# Patient Record
Sex: Female | Born: 1935
Health system: Southern US, Community
[De-identification: ages and names within clinical notes are randomized; demographics above are authoritative.]

## PROBLEM LIST (undated history)

## (undated) DIAGNOSIS — I10 Essential (primary) hypertension: Secondary | ICD-10-CM

## (undated) DIAGNOSIS — M1990 Unspecified osteoarthritis, unspecified site: Secondary | ICD-10-CM

## (undated) DIAGNOSIS — I4891 Unspecified atrial fibrillation: Secondary | ICD-10-CM

## (undated) DIAGNOSIS — F0390 Unspecified dementia without behavioral disturbance: Secondary | ICD-10-CM

## (undated) HISTORY — DX: Unspecified dementia, unspecified severity, without behavioral disturbance, psychotic disturbance, mood disturbance, and anxiety: F03.90

## (undated) HISTORY — DX: Unspecified osteoarthritis, unspecified site: M19.90

## (undated) HISTORY — DX: Unspecified atrial fibrillation: I48.91

## (undated) HISTORY — DX: Essential (primary) hypertension: I10

---

## 2019-12-27 DIAGNOSIS — Z20828 Contact with and (suspected) exposure to other viral communicable diseases: Secondary | ICD-10-CM | POA: Diagnosis not present

## 2020-01-03 DIAGNOSIS — K5900 Constipation, unspecified: Secondary | ICD-10-CM | POA: Diagnosis not present

## 2020-01-03 DIAGNOSIS — G8929 Other chronic pain: Secondary | ICD-10-CM | POA: Diagnosis not present

## 2020-01-03 DIAGNOSIS — I4891 Unspecified atrial fibrillation: Secondary | ICD-10-CM | POA: Diagnosis not present

## 2020-01-03 DIAGNOSIS — D6869 Other thrombophilia: Secondary | ICD-10-CM | POA: Diagnosis not present

## 2020-01-03 DIAGNOSIS — Z809 Family history of malignant neoplasm, unspecified: Secondary | ICD-10-CM | POA: Diagnosis not present

## 2020-01-03 DIAGNOSIS — Z7409 Other reduced mobility: Secondary | ICD-10-CM | POA: Diagnosis not present

## 2020-01-03 DIAGNOSIS — I10 Essential (primary) hypertension: Secondary | ICD-10-CM | POA: Diagnosis not present

## 2020-01-03 DIAGNOSIS — E785 Hyperlipidemia, unspecified: Secondary | ICD-10-CM | POA: Diagnosis not present

## 2020-01-03 DIAGNOSIS — R69 Illness, unspecified: Secondary | ICD-10-CM | POA: Diagnosis not present

## 2020-01-03 DIAGNOSIS — Z7901 Long term (current) use of anticoagulants: Secondary | ICD-10-CM | POA: Diagnosis not present

## 2020-01-30 DIAGNOSIS — L821 Other seborrheic keratosis: Secondary | ICD-10-CM | POA: Diagnosis not present

## 2020-01-30 DIAGNOSIS — L814 Other melanin hyperpigmentation: Secondary | ICD-10-CM | POA: Diagnosis not present

## 2020-02-21 DIAGNOSIS — I10 Essential (primary) hypertension: Secondary | ICD-10-CM | POA: Diagnosis not present

## 2020-02-21 DIAGNOSIS — E785 Hyperlipidemia, unspecified: Secondary | ICD-10-CM | POA: Diagnosis not present

## 2020-02-21 DIAGNOSIS — Z7901 Long term (current) use of anticoagulants: Secondary | ICD-10-CM | POA: Diagnosis not present

## 2020-02-21 DIAGNOSIS — I482 Chronic atrial fibrillation, unspecified: Secondary | ICD-10-CM | POA: Diagnosis not present

## 2020-04-30 DIAGNOSIS — H35371 Puckering of macula, right eye: Secondary | ICD-10-CM | POA: Diagnosis not present

## 2020-04-30 DIAGNOSIS — H401134 Primary open-angle glaucoma, bilateral, indeterminate stage: Secondary | ICD-10-CM | POA: Diagnosis not present

## 2020-05-19 DIAGNOSIS — E785 Hyperlipidemia, unspecified: Secondary | ICD-10-CM | POA: Diagnosis not present

## 2020-05-19 DIAGNOSIS — M25551 Pain in right hip: Secondary | ICD-10-CM | POA: Diagnosis not present

## 2020-05-19 DIAGNOSIS — R413 Other amnesia: Secondary | ICD-10-CM | POA: Diagnosis not present

## 2020-05-19 DIAGNOSIS — M1990 Unspecified osteoarthritis, unspecified site: Secondary | ICD-10-CM | POA: Diagnosis not present

## 2020-05-19 DIAGNOSIS — I10 Essential (primary) hypertension: Secondary | ICD-10-CM | POA: Diagnosis not present

## 2020-05-19 DIAGNOSIS — Z9181 History of falling: Secondary | ICD-10-CM | POA: Diagnosis not present

## 2020-05-19 DIAGNOSIS — I4891 Unspecified atrial fibrillation: Secondary | ICD-10-CM | POA: Diagnosis not present

## 2020-05-19 DIAGNOSIS — Z7901 Long term (current) use of anticoagulants: Secondary | ICD-10-CM | POA: Diagnosis not present

## 2020-05-19 DIAGNOSIS — M25552 Pain in left hip: Secondary | ICD-10-CM | POA: Diagnosis not present

## 2020-05-25 DIAGNOSIS — Z7901 Long term (current) use of anticoagulants: Secondary | ICD-10-CM | POA: Diagnosis not present

## 2020-05-25 DIAGNOSIS — M25552 Pain in left hip: Secondary | ICD-10-CM | POA: Diagnosis not present

## 2020-05-25 DIAGNOSIS — E785 Hyperlipidemia, unspecified: Secondary | ICD-10-CM | POA: Diagnosis not present

## 2020-05-25 DIAGNOSIS — Z9181 History of falling: Secondary | ICD-10-CM | POA: Diagnosis not present

## 2020-05-25 DIAGNOSIS — M25551 Pain in right hip: Secondary | ICD-10-CM | POA: Diagnosis not present

## 2020-05-25 DIAGNOSIS — M1990 Unspecified osteoarthritis, unspecified site: Secondary | ICD-10-CM | POA: Diagnosis not present

## 2020-05-25 DIAGNOSIS — R413 Other amnesia: Secondary | ICD-10-CM | POA: Diagnosis not present

## 2020-05-25 DIAGNOSIS — I10 Essential (primary) hypertension: Secondary | ICD-10-CM | POA: Diagnosis not present

## 2020-05-25 DIAGNOSIS — I4891 Unspecified atrial fibrillation: Secondary | ICD-10-CM | POA: Diagnosis not present

## 2020-05-29 DIAGNOSIS — M25551 Pain in right hip: Secondary | ICD-10-CM | POA: Diagnosis not present

## 2020-05-29 DIAGNOSIS — M1990 Unspecified osteoarthritis, unspecified site: Secondary | ICD-10-CM | POA: Diagnosis not present

## 2020-05-29 DIAGNOSIS — Z9181 History of falling: Secondary | ICD-10-CM | POA: Diagnosis not present

## 2020-05-29 DIAGNOSIS — Z7901 Long term (current) use of anticoagulants: Secondary | ICD-10-CM | POA: Diagnosis not present

## 2020-05-29 DIAGNOSIS — E785 Hyperlipidemia, unspecified: Secondary | ICD-10-CM | POA: Diagnosis not present

## 2020-05-29 DIAGNOSIS — I10 Essential (primary) hypertension: Secondary | ICD-10-CM | POA: Diagnosis not present

## 2020-05-29 DIAGNOSIS — R413 Other amnesia: Secondary | ICD-10-CM | POA: Diagnosis not present

## 2020-05-29 DIAGNOSIS — M25552 Pain in left hip: Secondary | ICD-10-CM | POA: Diagnosis not present

## 2020-05-29 DIAGNOSIS — I4891 Unspecified atrial fibrillation: Secondary | ICD-10-CM | POA: Diagnosis not present

## 2020-05-31 DIAGNOSIS — E785 Hyperlipidemia, unspecified: Secondary | ICD-10-CM | POA: Diagnosis not present

## 2020-05-31 DIAGNOSIS — M25552 Pain in left hip: Secondary | ICD-10-CM | POA: Diagnosis not present

## 2020-05-31 DIAGNOSIS — R413 Other amnesia: Secondary | ICD-10-CM | POA: Diagnosis not present

## 2020-05-31 DIAGNOSIS — I4891 Unspecified atrial fibrillation: Secondary | ICD-10-CM | POA: Diagnosis not present

## 2020-05-31 DIAGNOSIS — I10 Essential (primary) hypertension: Secondary | ICD-10-CM | POA: Diagnosis not present

## 2020-05-31 DIAGNOSIS — M25551 Pain in right hip: Secondary | ICD-10-CM | POA: Diagnosis not present

## 2020-05-31 DIAGNOSIS — M1990 Unspecified osteoarthritis, unspecified site: Secondary | ICD-10-CM | POA: Diagnosis not present

## 2020-05-31 DIAGNOSIS — Z9181 History of falling: Secondary | ICD-10-CM | POA: Diagnosis not present

## 2020-05-31 DIAGNOSIS — Z7901 Long term (current) use of anticoagulants: Secondary | ICD-10-CM | POA: Diagnosis not present

## 2020-06-05 DIAGNOSIS — I4891 Unspecified atrial fibrillation: Secondary | ICD-10-CM | POA: Diagnosis not present

## 2020-06-05 DIAGNOSIS — R413 Other amnesia: Secondary | ICD-10-CM | POA: Diagnosis not present

## 2020-06-05 DIAGNOSIS — E785 Hyperlipidemia, unspecified: Secondary | ICD-10-CM | POA: Diagnosis not present

## 2020-06-05 DIAGNOSIS — Z9181 History of falling: Secondary | ICD-10-CM | POA: Diagnosis not present

## 2020-06-05 DIAGNOSIS — M25552 Pain in left hip: Secondary | ICD-10-CM | POA: Diagnosis not present

## 2020-06-05 DIAGNOSIS — I10 Essential (primary) hypertension: Secondary | ICD-10-CM | POA: Diagnosis not present

## 2020-06-05 DIAGNOSIS — M25551 Pain in right hip: Secondary | ICD-10-CM | POA: Diagnosis not present

## 2020-06-05 DIAGNOSIS — M1990 Unspecified osteoarthritis, unspecified site: Secondary | ICD-10-CM | POA: Diagnosis not present

## 2020-06-05 DIAGNOSIS — Z7901 Long term (current) use of anticoagulants: Secondary | ICD-10-CM | POA: Diagnosis not present

## 2020-06-07 DIAGNOSIS — I4891 Unspecified atrial fibrillation: Secondary | ICD-10-CM | POA: Diagnosis not present

## 2020-06-07 DIAGNOSIS — R413 Other amnesia: Secondary | ICD-10-CM | POA: Diagnosis not present

## 2020-06-07 DIAGNOSIS — M25551 Pain in right hip: Secondary | ICD-10-CM | POA: Diagnosis not present

## 2020-06-07 DIAGNOSIS — E785 Hyperlipidemia, unspecified: Secondary | ICD-10-CM | POA: Diagnosis not present

## 2020-06-07 DIAGNOSIS — Z9181 History of falling: Secondary | ICD-10-CM | POA: Diagnosis not present

## 2020-06-07 DIAGNOSIS — M25552 Pain in left hip: Secondary | ICD-10-CM | POA: Diagnosis not present

## 2020-06-07 DIAGNOSIS — Z7901 Long term (current) use of anticoagulants: Secondary | ICD-10-CM | POA: Diagnosis not present

## 2020-06-07 DIAGNOSIS — I10 Essential (primary) hypertension: Secondary | ICD-10-CM | POA: Diagnosis not present

## 2020-06-07 DIAGNOSIS — M1990 Unspecified osteoarthritis, unspecified site: Secondary | ICD-10-CM | POA: Diagnosis not present

## 2020-06-12 DIAGNOSIS — I4891 Unspecified atrial fibrillation: Secondary | ICD-10-CM | POA: Diagnosis not present

## 2020-06-12 DIAGNOSIS — Z9181 History of falling: Secondary | ICD-10-CM | POA: Diagnosis not present

## 2020-06-12 DIAGNOSIS — E785 Hyperlipidemia, unspecified: Secondary | ICD-10-CM | POA: Diagnosis not present

## 2020-06-12 DIAGNOSIS — M1990 Unspecified osteoarthritis, unspecified site: Secondary | ICD-10-CM | POA: Diagnosis not present

## 2020-06-12 DIAGNOSIS — R413 Other amnesia: Secondary | ICD-10-CM | POA: Diagnosis not present

## 2020-06-12 DIAGNOSIS — M25552 Pain in left hip: Secondary | ICD-10-CM | POA: Diagnosis not present

## 2020-06-12 DIAGNOSIS — I10 Essential (primary) hypertension: Secondary | ICD-10-CM | POA: Diagnosis not present

## 2020-06-12 DIAGNOSIS — Z7901 Long term (current) use of anticoagulants: Secondary | ICD-10-CM | POA: Diagnosis not present

## 2020-06-12 DIAGNOSIS — M25551 Pain in right hip: Secondary | ICD-10-CM | POA: Diagnosis not present

## 2020-06-14 DIAGNOSIS — E785 Hyperlipidemia, unspecified: Secondary | ICD-10-CM | POA: Diagnosis not present

## 2020-06-14 DIAGNOSIS — Z7901 Long term (current) use of anticoagulants: Secondary | ICD-10-CM | POA: Diagnosis not present

## 2020-06-14 DIAGNOSIS — R413 Other amnesia: Secondary | ICD-10-CM | POA: Diagnosis not present

## 2020-06-14 DIAGNOSIS — M25552 Pain in left hip: Secondary | ICD-10-CM | POA: Diagnosis not present

## 2020-06-14 DIAGNOSIS — M1990 Unspecified osteoarthritis, unspecified site: Secondary | ICD-10-CM | POA: Diagnosis not present

## 2020-06-14 DIAGNOSIS — Z9181 History of falling: Secondary | ICD-10-CM | POA: Diagnosis not present

## 2020-06-14 DIAGNOSIS — I10 Essential (primary) hypertension: Secondary | ICD-10-CM | POA: Diagnosis not present

## 2020-06-14 DIAGNOSIS — M25551 Pain in right hip: Secondary | ICD-10-CM | POA: Diagnosis not present

## 2020-06-14 DIAGNOSIS — I4891 Unspecified atrial fibrillation: Secondary | ICD-10-CM | POA: Diagnosis not present

## 2020-06-19 DIAGNOSIS — M1990 Unspecified osteoarthritis, unspecified site: Secondary | ICD-10-CM | POA: Diagnosis not present

## 2020-06-19 DIAGNOSIS — I4891 Unspecified atrial fibrillation: Secondary | ICD-10-CM | POA: Diagnosis not present

## 2020-06-19 DIAGNOSIS — R413 Other amnesia: Secondary | ICD-10-CM | POA: Diagnosis not present

## 2020-06-19 DIAGNOSIS — I10 Essential (primary) hypertension: Secondary | ICD-10-CM | POA: Diagnosis not present

## 2020-06-19 DIAGNOSIS — Z9181 History of falling: Secondary | ICD-10-CM | POA: Diagnosis not present

## 2020-06-19 DIAGNOSIS — M25551 Pain in right hip: Secondary | ICD-10-CM | POA: Diagnosis not present

## 2020-06-19 DIAGNOSIS — E785 Hyperlipidemia, unspecified: Secondary | ICD-10-CM | POA: Diagnosis not present

## 2020-06-19 DIAGNOSIS — M25552 Pain in left hip: Secondary | ICD-10-CM | POA: Diagnosis not present

## 2020-06-19 DIAGNOSIS — Z7901 Long term (current) use of anticoagulants: Secondary | ICD-10-CM | POA: Diagnosis not present

## 2020-06-26 DIAGNOSIS — I10 Essential (primary) hypertension: Secondary | ICD-10-CM | POA: Diagnosis not present

## 2020-06-26 DIAGNOSIS — Z9181 History of falling: Secondary | ICD-10-CM | POA: Diagnosis not present

## 2020-06-26 DIAGNOSIS — I4891 Unspecified atrial fibrillation: Secondary | ICD-10-CM | POA: Diagnosis not present

## 2020-06-26 DIAGNOSIS — Z7901 Long term (current) use of anticoagulants: Secondary | ICD-10-CM | POA: Diagnosis not present

## 2020-06-26 DIAGNOSIS — R413 Other amnesia: Secondary | ICD-10-CM | POA: Diagnosis not present

## 2020-06-26 DIAGNOSIS — M1990 Unspecified osteoarthritis, unspecified site: Secondary | ICD-10-CM | POA: Diagnosis not present

## 2020-06-26 DIAGNOSIS — E785 Hyperlipidemia, unspecified: Secondary | ICD-10-CM | POA: Diagnosis not present

## 2020-06-26 DIAGNOSIS — M25552 Pain in left hip: Secondary | ICD-10-CM | POA: Diagnosis not present

## 2020-06-26 DIAGNOSIS — M25551 Pain in right hip: Secondary | ICD-10-CM | POA: Diagnosis not present

## 2020-07-05 DIAGNOSIS — E785 Hyperlipidemia, unspecified: Secondary | ICD-10-CM | POA: Diagnosis not present

## 2020-07-05 DIAGNOSIS — M25552 Pain in left hip: Secondary | ICD-10-CM | POA: Diagnosis not present

## 2020-07-05 DIAGNOSIS — I4891 Unspecified atrial fibrillation: Secondary | ICD-10-CM | POA: Diagnosis not present

## 2020-07-05 DIAGNOSIS — Z9181 History of falling: Secondary | ICD-10-CM | POA: Diagnosis not present

## 2020-07-05 DIAGNOSIS — M1990 Unspecified osteoarthritis, unspecified site: Secondary | ICD-10-CM | POA: Diagnosis not present

## 2020-07-05 DIAGNOSIS — Z7901 Long term (current) use of anticoagulants: Secondary | ICD-10-CM | POA: Diagnosis not present

## 2020-07-05 DIAGNOSIS — R413 Other amnesia: Secondary | ICD-10-CM | POA: Diagnosis not present

## 2020-07-05 DIAGNOSIS — M25551 Pain in right hip: Secondary | ICD-10-CM | POA: Diagnosis not present

## 2020-07-05 DIAGNOSIS — I10 Essential (primary) hypertension: Secondary | ICD-10-CM | POA: Diagnosis not present

## 2020-07-13 DIAGNOSIS — R413 Other amnesia: Secondary | ICD-10-CM | POA: Diagnosis not present

## 2020-07-13 DIAGNOSIS — M1990 Unspecified osteoarthritis, unspecified site: Secondary | ICD-10-CM | POA: Diagnosis not present

## 2020-07-13 DIAGNOSIS — I4891 Unspecified atrial fibrillation: Secondary | ICD-10-CM | POA: Diagnosis not present

## 2020-07-13 DIAGNOSIS — Z9181 History of falling: Secondary | ICD-10-CM | POA: Diagnosis not present

## 2020-07-13 DIAGNOSIS — E785 Hyperlipidemia, unspecified: Secondary | ICD-10-CM | POA: Diagnosis not present

## 2020-07-13 DIAGNOSIS — Z7901 Long term (current) use of anticoagulants: Secondary | ICD-10-CM | POA: Diagnosis not present

## 2020-07-13 DIAGNOSIS — M25551 Pain in right hip: Secondary | ICD-10-CM | POA: Diagnosis not present

## 2020-07-13 DIAGNOSIS — M25552 Pain in left hip: Secondary | ICD-10-CM | POA: Diagnosis not present

## 2020-07-13 DIAGNOSIS — I10 Essential (primary) hypertension: Secondary | ICD-10-CM | POA: Diagnosis not present

## 2020-10-02 DIAGNOSIS — K5900 Constipation, unspecified: Secondary | ICD-10-CM | POA: Diagnosis not present

## 2020-10-02 DIAGNOSIS — E669 Obesity, unspecified: Secondary | ICD-10-CM | POA: Diagnosis not present

## 2020-10-02 DIAGNOSIS — E785 Hyperlipidemia, unspecified: Secondary | ICD-10-CM | POA: Diagnosis not present

## 2020-10-02 DIAGNOSIS — I4891 Unspecified atrial fibrillation: Secondary | ICD-10-CM | POA: Diagnosis not present

## 2020-10-02 DIAGNOSIS — Z7982 Long term (current) use of aspirin: Secondary | ICD-10-CM | POA: Diagnosis not present

## 2020-10-02 DIAGNOSIS — M1990 Unspecified osteoarthritis, unspecified site: Secondary | ICD-10-CM | POA: Diagnosis not present

## 2020-10-02 DIAGNOSIS — R69 Illness, unspecified: Secondary | ICD-10-CM | POA: Diagnosis not present

## 2020-10-02 DIAGNOSIS — D6869 Other thrombophilia: Secondary | ICD-10-CM | POA: Diagnosis not present

## 2020-10-02 DIAGNOSIS — I10 Essential (primary) hypertension: Secondary | ICD-10-CM | POA: Diagnosis not present

## 2020-10-02 DIAGNOSIS — Z7901 Long term (current) use of anticoagulants: Secondary | ICD-10-CM | POA: Diagnosis not present

## 2020-10-09 DIAGNOSIS — E7849 Other hyperlipidemia: Secondary | ICD-10-CM | POA: Diagnosis not present

## 2020-10-09 DIAGNOSIS — I4891 Unspecified atrial fibrillation: Secondary | ICD-10-CM | POA: Diagnosis not present

## 2020-10-09 DIAGNOSIS — I10 Essential (primary) hypertension: Secondary | ICD-10-CM | POA: Diagnosis not present

## 2020-10-09 DIAGNOSIS — R609 Edema, unspecified: Secondary | ICD-10-CM | POA: Diagnosis not present

## 2020-10-09 DIAGNOSIS — Z681 Body mass index (BMI) 19 or less, adult: Secondary | ICD-10-CM | POA: Diagnosis not present

## 2020-10-10 DIAGNOSIS — H25813 Combined forms of age-related cataract, bilateral: Secondary | ICD-10-CM | POA: Diagnosis not present

## 2020-10-10 DIAGNOSIS — H401134 Primary open-angle glaucoma, bilateral, indeterminate stage: Secondary | ICD-10-CM | POA: Diagnosis not present

## 2020-12-24 DIAGNOSIS — Z681 Body mass index (BMI) 19 or less, adult: Secondary | ICD-10-CM | POA: Diagnosis not present

## 2020-12-24 DIAGNOSIS — R69 Illness, unspecified: Secondary | ICD-10-CM | POA: Diagnosis not present

## 2020-12-24 DIAGNOSIS — I4891 Unspecified atrial fibrillation: Secondary | ICD-10-CM | POA: Diagnosis not present

## 2020-12-24 DIAGNOSIS — M1991 Primary osteoarthritis, unspecified site: Secondary | ICD-10-CM | POA: Diagnosis not present

## 2021-02-21 ENCOUNTER — Encounter (INDEPENDENT_AMBULATORY_CARE_PROVIDER_SITE_OTHER): Payer: Self-pay

## 2021-02-21 ENCOUNTER — Other Ambulatory Visit: Payer: Self-pay

## 2021-02-21 ENCOUNTER — Encounter: Payer: Self-pay | Admitting: Internal Medicine

## 2021-02-21 ENCOUNTER — Ambulatory Visit (INDEPENDENT_AMBULATORY_CARE_PROVIDER_SITE_OTHER): Payer: Medicare HMO | Admitting: Internal Medicine

## 2021-02-21 VITALS — BP 112/64 | HR 75 | Temp 98.2°F | Ht 65.0 in | Wt 177.1 lb

## 2021-02-21 DIAGNOSIS — E559 Vitamin D deficiency, unspecified: Secondary | ICD-10-CM | POA: Diagnosis not present

## 2021-02-21 DIAGNOSIS — K5900 Constipation, unspecified: Secondary | ICD-10-CM | POA: Insufficient documentation

## 2021-02-21 DIAGNOSIS — R5381 Other malaise: Secondary | ICD-10-CM | POA: Insufficient documentation

## 2021-02-21 DIAGNOSIS — K5909 Other constipation: Secondary | ICD-10-CM | POA: Diagnosis not present

## 2021-02-21 DIAGNOSIS — Z7689 Persons encountering health services in other specified circumstances: Secondary | ICD-10-CM | POA: Diagnosis not present

## 2021-02-21 DIAGNOSIS — Z2821 Immunization not carried out because of patient refusal: Secondary | ICD-10-CM | POA: Diagnosis not present

## 2021-02-21 DIAGNOSIS — M179 Osteoarthritis of knee, unspecified: Secondary | ICD-10-CM | POA: Insufficient documentation

## 2021-02-21 DIAGNOSIS — R69 Illness, unspecified: Secondary | ICD-10-CM | POA: Diagnosis not present

## 2021-02-21 DIAGNOSIS — I480 Paroxysmal atrial fibrillation: Secondary | ICD-10-CM

## 2021-02-21 DIAGNOSIS — G309 Alzheimer's disease, unspecified: Secondary | ICD-10-CM

## 2021-02-21 DIAGNOSIS — I10 Essential (primary) hypertension: Secondary | ICD-10-CM

## 2021-02-21 DIAGNOSIS — I4891 Unspecified atrial fibrillation: Secondary | ICD-10-CM | POA: Insufficient documentation

## 2021-02-21 DIAGNOSIS — K5904 Chronic idiopathic constipation: Secondary | ICD-10-CM | POA: Insufficient documentation

## 2021-02-21 DIAGNOSIS — M170 Bilateral primary osteoarthritis of knee: Secondary | ICD-10-CM | POA: Diagnosis not present

## 2021-02-21 DIAGNOSIS — F0390 Unspecified dementia without behavioral disturbance: Secondary | ICD-10-CM | POA: Insufficient documentation

## 2021-02-21 DIAGNOSIS — F02B0 Dementia in other diseases classified elsewhere, moderate, without behavioral disturbance, psychotic disturbance, mood disturbance, and anxiety: Secondary | ICD-10-CM | POA: Insufficient documentation

## 2021-02-21 NOTE — Assessment & Plan Note (Signed)
On donepezil and memantine A&O X 3 currently, but sleeps at daytime and stays awake during nighttime No episodes of agitation, but does not talk much with other people than her daughter and son-in-law 

## 2021-02-21 NOTE — Assessment & Plan Note (Signed)
Care established History and medications reviewed with the patient 

## 2021-02-21 NOTE — Assessment & Plan Note (Signed)
Rate-controlled with Metoprolol On Eliquis Followed by Cardiology in [place] 

## 2021-02-21 NOTE — Progress Notes (Addendum)
New Patient Office Visit  Subjective:  Patient ID: Brittany Hanson, female    DOB: 1931-10-15  Age: 87 y.o. MRN: [id number]  CC:  Chief Complaint  Patient presents with   New Patient (Initial Visit)    New pt. Previous PCP [facility]. Needs evaluation for motorized wheelchair    HPI Brittany Hanson is an 87 y.o. female with PMH of atrial fibrillation, HTN, Alzheimer's dementia, diffuse OA and physical deconditioning who presents for establishing care.  Her daughter and son-in-law were present during the visit.  HTN: BP is well-controlled. Takes medications regularly. Patient denies headache, dizziness, chest pain, dyspnea or palpitations.  Atrial fibrillation: She is followed by cardiology in [place].  She is on metoprolol and Eliquis currently.  She does not have any history of cardiac procedure in the past.  Her heart rate is irregular in the office today.  Diffuse OA: She has history of OA of knee and hip.  It appears that she has OA of upper extremity as well.  She is wheelchair-bound most of the time of the day.  She gets up to use the restroom only, but she is dependent on wheelchair for ambulation at other times.  Due to limited mobility for a long time, she has underlying physical deconditioning as well.  She tried home PT in the past, but could not continue it due to severe pain.  She would benefit from electric wheelchair for better mobility and safety in the house, including getting to the bathroom.  Due to her OA of hands, she is not able to roll the walker or manual wheelchair.  Alzheimer's dementia: She is on donepezil and memantine currently.  She is dependent for her ADLs.  She is able to eat by herself, but daughter has to help prepare the meals, bathing and clothing.  She watches TV during the nighttime and and sleeps during the daytime.  Daughter has tried to orient her multiple times in the past.  She has not had any recent episodes of agitation.  Daughter does not  report any episodes of delusions or hallucinations.  She has not had COVID vaccine. She refuses flu vaccine.  Past Medical History:  Diagnosis Date   Atrial fibrillation (HCC)    Dementia (HCC)    Hypertension    Osteoarthritis     History reviewed. No pertinent surgical history.  History reviewed. No pertinent family history.  Social History   Socioeconomic History   Marital status: Widowed    Spouse name: Not on file   Number of children: Not on file   Years of education: Not on file   Highest education level: Not on file  Occupational History   Not on file  Tobacco Use   Smoking status: Never   Smokeless tobacco: Never  Substance and Sexual Activity   Alcohol use: Not on file   Drug use: Not on file   Sexual activity: Not on file  Other Topics Concern   Not on file  Social History Narrative   Not on file   Social Determinants of Health   Financial Resource Strain: Not on file  Food Insecurity: Not on file  Transportation Needs: Not on file  Physical Activity: Not on file  Stress: Not on file  Social Connections: Not on file  Intimate Partner Violence: Not on file    ROS Review of Systems  Constitutional:  Positive for fatigue. Negative for chills and fever.  HENT:  Negative for congestion, sinus pressure, sinus pain  and sore throat.   Eyes:  Negative for pain and discharge.  Respiratory:  Negative for cough and shortness of breath.   Cardiovascular:  Negative for chest pain and palpitations.  Gastrointestinal:  Positive for constipation. Negative for abdominal pain, diarrhea, nausea and vomiting.  Endocrine: Negative for polydipsia and polyuria.  Genitourinary:  Negative for dysuria and hematuria.  Musculoskeletal:  Positive for arthralgias, back pain and gait problem. Negative for neck pain and neck stiffness.  Skin:  Negative for rash.  Neurological:  Negative for dizziness and weakness.  Psychiatric/Behavioral:  Positive for decreased concentration  and sleep disturbance. Negative for agitation and behavioral problems.    Objective:   Today's Vitals: BP 112/64 (BP Location: Left Arm, Patient Position: Sitting, Cuff Size: Normal)   Pulse 75   Temp 98.2 F (36.8 C) (Oral)   Ht $R'5\' 5"'er$  (1.651 m)   Wt 177 lb 1.9 oz (80.3 kg)   SpO2 97%   BMI 29.47 kg/m   Physical Exam Vitals reviewed.  Constitutional:      General: She is not in acute distress.    Appearance: She is not diaphoretic.     Comments: In wheelchair  HENT:     Head: Normocephalic and atraumatic.     Mouth/Throat:     Mouth: Mucous membranes are moist.  Eyes:     General: No scleral icterus.    Extraocular Movements: Extraocular movements intact.  Cardiovascular:     Rate and Rhythm: Normal rate. Rhythm irregular.     Pulses: Normal pulses.     Heart sounds: Normal heart sounds. No murmur heard. Pulmonary:     Breath sounds: Normal breath sounds. No wheezing or rales.  Abdominal:     Palpations: Abdomen is soft.     Tenderness: There is no abdominal tenderness.  Musculoskeletal:        General: Swelling (B/l knee) present.     Cervical back: Neck supple. No tenderness.     Right lower leg: No edema.     Left lower leg: No edema.     Comments: ROM limited at b/l knee up to 45 degrees  Skin:    General: Skin is warm.     Findings: No rash.  Neurological:     General: No focal deficit present.     Mental Status: She is alert. Mental status is at baseline.     Motor: Weakness (RLE and LLE - 2/5, RUE and LUE - 2/5) present.     Gait: Gait abnormal (Non-ambulatory mostly).  Psychiatric:        Mood and Affect: Mood normal.        Behavior: Behavior normal.    Assessment & Plan:   Problem List Items Addressed This Visit       Encounter to establish care - Primary   Care established History and medications reviewed with the patient     Relevant Orders  HgB A1c    Cardiovascular and Mediastinum   Atrial fibrillation ([place])    Rate-controlled with  Metoprolol On Eliquis Followed by Cardiology in [place]      Relevant Medications   losartan (COZAAR) 100 MG tablet   hydrochlorothiazide (HYDRODIURIL) 25 MG tablet   metoprolol succinate (TOPROL-XL) 50 MG 24 hr tablet   ELIQUIS 5 MG TABS tablet   simvastatin (ZOCOR) 20 MG tablet   Other Relevant Orders   TSH   Essential hypertension    BP Readings from Last 1 Encounters:  02/21/21 112/64  Well-controlled with  Losartan, HCTZ and Metoprolol Counseled for compliance with the medications Advised DASH diet      Relevant Medications   losartan (COZAAR) 100 MG tablet   hydrochlorothiazide (HYDRODIURIL) 25 MG tablet   metoprolol succinate (TOPROL-XL) 50 MG 24 hr tablet   ELIQUIS 5 MG TABS tablet   simvastatin (ZOCOR) 20 MG tablet   Other Relevant Orders   CBC with Differential/Platelet   CMP14+EGFR     Digestive   Chronic constipation    Uses Miralax PRN      Relevant Medications   polyethylene glycol (MIRALAX / GLYCOLAX) 17 g packet     Nervous and Auditory   Dementia (HCC)    On donepezil and memantine A&O X 3 currently, but sleeps at daytime and stays awake during nighttime No episodes of agitation, but does not talk much with other people than her daughter and son-in-law      Relevant Medications   memantine (NAMENDA) 5 MG tablet   donepezil (ARICEPT) 10 MG tablet   Other Relevant Orders   DME Wheelchair electric     Musculoskeletal and Integument   OA (osteoarthritis) of knee    Tylenol PRN Due to diffuse OA, her mobility has been very limited      Relevant Medications   Acetaminophen (TYLENOL ARTHRITIS PAIN PO)   Other Relevant Orders   DME Wheelchair electric     Other               Physical deconditioning    Due to OA, dementia and lack of mobility Dependent for ADLs, daugher and son-in-law have been helping at home She had home PT in the past, does not want it currently  Has a wheelchair currently, but needs an electric wheelchair for better  mobility in her home independently, which will improve her quality of life  She can safely use electric wheelchair. She is willing and motivated to use the power mobility device in the home.     Relevant Orders   DME Wheelchair electric   TSH   Vitamin D (25 hydroxy)   Other Visit Diagnoses     Vitamin D deficiency       Relevant Orders   Vitamin D (25 hydroxy)       Outpatient Encounter Medications as of 02/21/2021  Medication Sig   Acetaminophen (TYLENOL ARTHRITIS PAIN PO) Take by mouth.   donepezil (ARICEPT) 10 MG tablet Take 10 mg by mouth at bedtime.   ELIQUIS 5 MG TABS tablet Take 5 mg by mouth 2 (two) times daily.   hydrochlorothiazide (HYDRODIURIL) 25 MG tablet Take 25 mg by mouth daily.   latanoprost (XALATAN) 0.005 % ophthalmic solution 1 drop at bedtime.   losartan (COZAAR) 100 MG tablet Take 100 mg by mouth daily.   memantine (NAMENDA) 5 MG tablet Take 5 mg by mouth 2 (two) times daily.   metoprolol succinate (TOPROL-XL) 50 MG 24 hr tablet Take 50 mg by mouth daily.   polyethylene glycol (MIRALAX / GLYCOLAX) 17 g packet Take 17 g by mouth daily.   simvastatin (ZOCOR) 20 MG tablet Take 20 mg by mouth at bedtime.   No facility-administered encounter medications on file as of 02/21/2021.    Follow-up: Return in about 3 months (around 05/24/2021).   Lindell Spar, MD

## 2021-02-21 NOTE — Assessment & Plan Note (Signed)
Tylenol PRN Due to diffuse OA, her mobility has been very limited

## 2021-02-21 NOTE — Patient Instructions (Signed)
Please continue to take medications as prescribed.  We will submit request for electric wheelchair today.  Please try to ambulate with the help of support as tolerated to avoid muscle atrophy and worsening of physical deconditioning.  Please let us know if you are interested in home physical therapy.

## 2021-02-21 NOTE — Assessment & Plan Note (Signed)
BP Readings from Last 1 Encounters:  02/21/21 112/64   Well-controlled with Losartan, HCTZ and Metoprolol Counseled for compliance with the medications Advised DASH diet

## 2021-02-21 NOTE — Assessment & Plan Note (Addendum)
Due to OA, dementia and lack of mobility Dependent for ADLs, daugher and son-in-law have been helping at home She had home PT in the past, does not want it currently  Has a wheelchair currently, but needs an electric wheelchair for better mobility in her home independently, which will improve her quality of life

## 2021-02-21 NOTE — Assessment & Plan Note (Signed)
Uses Miralax PRN

## 2021-03-04 ENCOUNTER — Telehealth: Payer: Self-pay

## 2021-03-04 NOTE — Telephone Encounter (Signed)
Patient daughter called back said can the wheelchair prescription be sent to [facility] home health in [place], [place], they take patient insurance and will cover the wheelchair.  Please contact patient daughter Bonita Quin once this has been sent to [facility] home health.  Call back # [phone].

## 2021-03-04 NOTE — Telephone Encounter (Signed)
Paperwork was faxed to [place] 03-04-21 awaiting form to be filled out by MD and will fax back please let daughter know

## 2021-03-04 NOTE — Telephone Encounter (Signed)
Patient daughter called asking about wheelchair if was it approved.  Daughter # [phone]. Daughter saying [place] does not accept her insurance.

## 2021-03-04 NOTE — Telephone Encounter (Signed)
noted 

## 2021-03-04 NOTE — Telephone Encounter (Signed)
Called patient daughter, patient daughter said patient has an PT appointment setup this week and need the dme order sent to [facility].  Fax the DME order for wheelchair to [facility] instead of [facility].

## 2021-03-05 DIAGNOSIS — Z7901 Long term (current) use of anticoagulants: Secondary | ICD-10-CM | POA: Diagnosis not present

## 2021-03-05 DIAGNOSIS — E785 Hyperlipidemia, unspecified: Secondary | ICD-10-CM | POA: Diagnosis not present

## 2021-03-05 DIAGNOSIS — I10 Essential (primary) hypertension: Secondary | ICD-10-CM | POA: Diagnosis not present

## 2021-03-05 DIAGNOSIS — I482 Chronic atrial fibrillation, unspecified: Secondary | ICD-10-CM | POA: Diagnosis not present

## 2021-03-12 ENCOUNTER — Other Ambulatory Visit: Payer: Self-pay | Admitting: *Deleted

## 2021-03-12 ENCOUNTER — Telehealth: Payer: Self-pay

## 2021-03-12 ENCOUNTER — Telehealth: Payer: Self-pay | Admitting: Internal Medicine

## 2021-03-12 DIAGNOSIS — R5381 Other malaise: Secondary | ICD-10-CM

## 2021-03-12 DIAGNOSIS — M170 Bilateral primary osteoarthritis of knee: Secondary | ICD-10-CM

## 2021-03-12 MED ORDER — METOPROLOL SUCCINATE ER 50 MG PO TB24: 50.0000 mg | ORAL_TABLET | Freq: Every day | ORAL | 0 refills | Status: DC

## 2021-03-12 NOTE — Telephone Encounter (Signed)
Pt wheelchair sent to [facility]

## 2021-03-12 NOTE — Telephone Encounter (Signed)
Spoke with Brittany Hanson she will be faxing over form from [facility] to be filled out with addended notes for pt wheelchair. We will be awaiting these forms.

## 2021-03-12 NOTE — Telephone Encounter (Signed)
Elease Hashimoto w/ [facility] called in speak w. Someone in regards to pt   Call back info [place] . [facility]   [phone]

## 2021-03-12 NOTE — Telephone Encounter (Signed)
Medication sent to pharmacy  

## 2021-03-12 NOTE — Telephone Encounter (Signed)
Patient called needs med refill Metotrolol 50 mg sent to [facility].

## 2021-03-13 ENCOUNTER — Telehealth: Payer: Self-pay

## 2021-03-13 ENCOUNTER — Other Ambulatory Visit: Payer: Self-pay | Admitting: *Deleted

## 2021-03-13 MED ORDER — SIMVASTATIN 20 MG PO TABS: 20.0000 mg | ORAL_TABLET | Freq: Every day | ORAL | 0 refills | Status: DC

## 2021-03-13 MED ORDER — DONEPEZIL HCL 10 MG PO TABS: 10.0000 mg | ORAL_TABLET | Freq: Every day | ORAL | 0 refills | Status: DC

## 2021-03-13 MED ORDER — HYDROCHLOROTHIAZIDE 25 MG PO TABS: 25.0000 mg | ORAL_TABLET | Freq: Every day | ORAL | 0 refills | Status: DC

## 2021-03-13 NOTE — Telephone Encounter (Signed)
Need med refills  simvastatin (ZOCOR) 20 MG tablet  hydrochlorothiazide (HYDRODIURIL) 25 MG tablet   donepezil (ARICEPT) 10 MG tablet   Pharmacy: [facility] [place]

## 2021-03-13 NOTE — Telephone Encounter (Signed)
Pt medication sent to pharmacy  

## 2021-03-19 ENCOUNTER — Ambulatory Visit: Payer: Medicare HMO | Admitting: Internal Medicine

## 2021-03-22 ENCOUNTER — Telehealth: Payer: Self-pay

## 2021-03-22 NOTE — Telephone Encounter (Signed)
Brittany Hanson called [facility] asking for copy of the revised office visit note for 10.13.2022.  call back # [phone].

## 2021-03-26 NOTE — Telephone Encounter (Signed)
Addended chart notes faxed back over to patricia at [facility]

## 2021-04-17 ENCOUNTER — Other Ambulatory Visit: Payer: Self-pay

## 2021-04-17 ENCOUNTER — Encounter (HOSPITAL_COMMUNITY): Payer: Self-pay

## 2021-04-17 ENCOUNTER — Ambulatory Visit (HOSPITAL_COMMUNITY): Payer: Medicare HMO | Attending: Internal Medicine

## 2021-04-17 DIAGNOSIS — R29898 Other symptoms and signs involving the musculoskeletal system: Secondary | ICD-10-CM | POA: Diagnosis not present

## 2021-04-17 NOTE — Therapy (Addendum)
[place], [place], [address] Phone: [phone]   Fax:  [phone]  Occupational Therapy Wheelchair Evaluation  Patient Details  Name: Brittany Hanson MRN: [id number] Date of Birth: 1932/04/15 Referring Provider (OT): Ave Filter, MD   Encounter Date: 04/17/2021   OT End of Session - 04/17/21 1600     Visit Number 1    Number of Visits 1    Authorization Type [employer]    Authorization Time Period $35 copay. No visit limit    Progress Note Due on Visit 10    OT Start Time 1300    OT Stop Time 1400    OT Time Calculation (min) 60 min    Activity Tolerance Patient tolerated treatment well    Behavior During Therapy WFL for tasks assessed/performed             Past Medical History:  Diagnosis Date   Atrial fibrillation ([place])    Dementia ([place])    Hypertension    Osteoarthritis     History reviewed. No pertinent surgical history.  There were no vitals filed for this visit.      [facility] OT Assessment - 04/17/21 1559       Assessment   Medical Diagnosis Wheelchair Evaluation    Referring Provider (OT) Ave Filter, MD      Precautions   Precautions Fall                 Mobility/Seating Evaluation    PATIENT INFORMATION: Name: Brittany Hanson DOB: 03/05/1932  Sex: Female Date seen: 04/17/21 Time: 1:00PM  Address:  [address] [place], [place] [address] Physician: Ihor Dow, MD This evaluation/justification form will serve as the LMN for the following suppliers: __________________________ Supplier: [employer] Contact Person: Casper Harrison Phone:  [phone]   Seating Therapist: Josue Hector Phone:   [phone]   Phone: [phone]    Spouse/Parent/Caregiver name: Brittany Hanson (daughter) Brittany Hanson (son-in-law) - Provide assist at home.   Phone number: [phone] Insurance/Payer: Bernadene Person     Reason for Referral: To obtain a  functional transport chair.   Patient/Caregiver Goals: To be able to move in her environment safely and independently while completing activities of daily living.  Patient was seen for face-to-face evaluation for new transport chair.  Also present was [employer] to discuss recommendations and wheelchair options.  Further paperwork was completed and sent to vendor.  Patient appears to qualify for manual mobility device at this time per objective findings.   MEDICAL HISTORY: Diagnosis: Primary Diagnosis: OA of bilateral knees Onset: several years Diagnosis: Alzheimers, dementia,Bilateral hips OA (reports bone on bone on the right side), HTN, Chronic A-Fib    '[]'$ Progressive Disease Relevant past and future surgeries: None   Height: 5'54" Weight: 174lb.  Explain recent changes or trends in weight: N/A   History including Falls: None    HOME ENVIRONMENT: $RemoveBeforeDEI'[x]'fpLjGHAvyRDXDbOK$ House  $Remo'[]'bRfty$ Condo/town home  $Rem'[]'BfGX$ Apartment  $RemoveBe'[]'DAeUCBQBy$ Assisted Living    '[]'$ Lives Alone $RemoveBef'[x]'gpYWLeHala$  Lives with Others  Hours with caregiver: N/A  [x] Home is accessible to patient           Stairs      [] Yes [x]  No     Ramp [x] Yes [] No Comments:  Lives with daughter and son-in-law, 2 granddaughters. Patient is never alone at home.      COMMUNITY ADL: TRANSPORTATION: [] Car    [x] Van    [] Public Transportation    [] Adapted w/c Lift    [] Ambulance    [] Other:       [] Sits in wheelchair during transport  Employment/School:  Specific requirements pertaining to mobility   Other: Family transports patient in [place]:  Handedness:   [x] Right     [] Left    [] NA  Comments:    Functional Processing Skills for Wheeled Mobility [x] Processing Skills are adequate for safe wheelchair operation  Areas of concern than may interfere with safe operation of wheelchair Description of problem   []  Attention to environment      [] Judgment      []   Hearing  []  Vision or visual processing      [] Motor Planning  []  Fluctuations in Behavior          VERBAL COMMUNICATION: [x] WFL receptive [x]  WFL expressive [x] Understandable  [] Difficult to understand  [] non-communicative []  Uses an augmented communication device  CURRENT SEATING / MOBILITY: Current Mobility Base:  [] None [x] Dependent [] Manual [] Scooter [] Power  Type of Control:   Manufacturer:  [id number] Size:  16x31 Age: 87 years old. (Daughter purchased herself)  Current Condition of Mobility Base:  Right side support clip not working adequately.   Current Wheelchair components:  [employer] Transport chair, flip down leg rests bilateral, sling back, no cushion   Describe posture in present seating system:  posterior pelvic tilt, rounded shoulder and forward head.      SENSATION and SKIN ISSUES: Sensation [x] Intact  [] Impaired [] Absent  Level of sensation:  Pressure Relief: Able to perform effective pressure relief :    [x] Yes  []  No Method: Patient is able to use walker to stand and relieve pressure. If not, Why?:   Skin Issues/Skin Integrity Current Skin Issues  [] Yes [x] No [] Intact []  Red area[]  Open Area  [] Scar Tissue [] At risk from prolonged sitting Where    History of Skin Issues  [] Yes [x] No Where   When    Hx of skin flap surgeries  [] Yes [x] No Where   When    Limited sitting tolerance [] Yes [x] No Hours spent sitting in wheelchair daily: 12+ hours is spent either sitting in transport when out in the community or at home in her lift chair.  Complaint of Pain:  Please describe: Complaints of pain noted when lower extremities are palpated to assess edema.     Swelling/Edema: bilateral lower extremity edema noted starting from knees down to toes. Pitting edema not present at this time.    ADL STATUS (in reference to wheelchair use):  Indep Assist Unable Indep with Equip Not assessed Comments  Dressing []  []  []  [x]  []  seated in wheelchair  Eating [x]  []  []  []   []    Toileting []  []  []  [x]  []  BSC  Bathing []  [x]  []  []  []  sponge bathes while seated. Daughter washed lower body seated at bathroom sink and hair in kitchen sink   Grooming/Hygiene []  [x]  []  []  []  Daughter assists   Meal Prep []  []  [x]  []  []  Family completes  IADLS []  []  [x]  []  []  Family completes  Bowel  Management: [x] Continent  [] Incontinent  [] Accidents Comments:    Bladder Management: [x] Continent  [] Incontinent  [] Accidents Comments:       WHEELCHAIR SKILLS: Manual w/c Propulsion: [x] UE or LE strength and endurance sufficient to participate in ADLs using manual wheelchair Arm : [] left [] right   [] Both      Distance:  Foot:  [] left [] right   [] Both  Operate Scooter: []  Strength, hand grip, balance and transfer appropriate for use [] Living environment is accessible for use of scooter  Operate Power w/c:  []  Std. Joystick   []  Alternative Controls Indep []  Assist []  Dependent/unable []  N/A []   [] Safe          []  Functional      Distance:   Bed confined without wheelchair []  Yes [x]  No   STRENGTH/RANGE OF MOTION:  Active Range of Motion Strength  Shoulder Bilateral shoulder flexion, abduction, IR/er: WFL Bilateral shoulder flexion, abduction, IR/er: 4+/5  Elbow Bilateral elbow flexion/extension: WFL Bilateral elbow flexion/extension: 4+/5  Wrist/Hand Bilateral wrist flexion/extension: WFL   Bilateral wrist flexion/extension: 4+/5. Bilateral gross grasp is weak.    Hip Right hip flexion is limited due to OA. Left hip flexion is WFL. Right hip flexion: 3-/5, left hip flexion: 4+/5  Knee Bilateral knee flexion/extension: WFL Right knee extension: 4-/5, Right knee flexion: 4+/5, Left knee flexion/extension: 4+/5  Ankle  Bilateral ankle dorsiflexion and plantar flexion: WFL Bilateral ankle dorsi flexion and plantar flexion: 4+/5     MOBILITY/BALANCE:  []  Patient is totally dependent for mobility  Family members provide total assist to propel tranport chair.     Balance Transfers  Ambulation  Sitting Balance: Standing Balance: [x]  Independent/Modified Independent []  Independent/Modified Independent  [x]  WFL     []  WFL []  Supervision []  Supervision  []  Uses UE for balance  []  Supervision []  Min Assist []  Ambulates with Assist      []  Min Assist [x]  Min assist []  Mod Assist [x]  Ambulates with Device:      [x]  RW  []  StW  []  Cane  []    []  Mod Assist []  Mod assist []  Max assist   []  Max Assist []  Max assist []  Dependent []  Indep. Short Distance Only  []  Unable []  Unable []  Lift / Sling Required Distance (in feet)  30   []  Sliding board []  Unable to Ambulate (see explanation below)  Cardio Status:  [] Intact  [x]  Impaired   []  NA     Chronic A-fib  Respiratory Status:  [x] Intact   [] Impaired   [] NA       Orthotics/Prosthetics: None  Comments (Address manual vs power w/c vs scooter): Patient is able to walk approximately 30 feet at a time before requiring a seated rest break. Patient spends the majority of her day seated in the recliner. Normally performs a sit to stand versus typical functional transfer; such as a stand pivot when going from surface to surface. Daughter, Brittany Hanson states that she will stand with her forearm walker then Brittany Hanson will switch out seats while standing. Completes sit to stand by pushing up from surface using both arms.           Anterior / Posterior Obliquity Rotation-Pelvis   PELVIS    [x]  []  []   Neutral Posterior Anterior  [x]  []  []   WFL Rt elev Lt elev  [x]  []  []   WFL Right Left                      Anterior  Anterior     []  Fixed []  Other []  Partly Flexible []  Flexible   []  Fixed []  Other []  Partly Flexible  []  Flexible  []  Fixed []  Other []  Partly Flexible  []  Flexible   TRUNK  [x]  []  []   WFL  Thoracic  Lumbar  Kyphosis Lordosis  [x]  []  []   WFL Convex Convex  Right Left [] c-curve [] s-curve [] multiple  [x]  Neutral []  Left-anterior []  Right-anterior     []  Fixed []  Flexible []  Partly Flexible []  Other  []  Fixed []   Flexible []  Partly Flexible []  Other  []  Fixed             []  Flexible []  Partly Flexible []  Other    Position Windswept    HIPS          [x]            []               []    Neutral       Abduct        ADduct         [x]           []            []   Neutral Right           Left      []  Fixed []  Subluxed []  Partly Flexible []  Dislocated []  Flexible  []  Fixed []  Other []  Partly Flexible  []  Flexible                 Foot Positioning Knee Positioning      [x]  WFL  [] Lt [] Rt [x]  WFL  [] Lt [] Rt    KNEES ROM concerns: ROM concerns:    & Dorsi-Flexed [] Lt [] Rt     FEET Plantar Flexed [] Lt [] Rt      Inversion                 [] Lt [] Rt      Eversion                 [] Lt [] Rt     HEAD [x]  Functional [x]  Good Head Control    & []  Flexed         []  Extended []  Adequate Head Control    NECK []  Rotated  Lt  []  Lat Flexed Lt []  Rotated  Rt []  Lat Flexed Rt []  Limited Head Control     []  Cervical Hyperextension []  Absent  Head Control     SHOULDERS ELBOWS WRIST& HAND       Left     Right    Left     Right    Left     Right   U/E [x] Functional           [x] Functional functional functional [] Fisting             [] Fisting      [] elev   [] dep      [] elev   [] dep       [] pro -[] retract     [] pro  [] retract [] subluxed             [] subluxed           Goals for Wheelchair Mobility  []  Independence with mobility in the home with motor related ADLs (MRADLs)  []  Independence with MRADLs in the community [x]  Provide dependent mobility  []  Provide recline     [] Provide tilt   Goals for Seating system []   Optimize pressure distribution []  Provide support needed to facilitate function or safety []  Provide corrective forces to assist with maintaining or improving posture []  Accommodate client's posture:   current seated postures and positions are not flexible or will not tolerate corrective forces []  Client to be independent with relieving pressure in the wheelchair [] Enhance physiological function  such as breathing, swallowing, digestion  Simulation ideas/Equipment trials: State why other equipment was unsuccessful:.Patient is able to ambulate short distances at home using her walker and does not require the extensive assistance from a mobility device such as a manual wheelchair or scooter. She is only able to ambulate short distance and family provides supervision for all mobility at home and in the community.        MOBILITY BASE RECOMMENDATIONS and JUSTIFICATION: MOBILITY COMPONENT JUSTIFICATION  Manufacturer: [employer] Model: Catalyst 5   Size: Width 19 Seat Depth 18  [x] provide transport from point A to B      [x] promote Indep mobility  [x] is not a safe, functional ambulator [] walker or cane inadequate [] non-standard width/depth necessary to accommodate anatomical measurement []    [x] Manual Mobility Base [x] non-functional ambulator    [] Scooter/POV  [] can safely operate  [] can safely transfer   [] has adequate trunk stability  [] cannot functionally propel manual w/c  [] Power Mobility Base  [] non-ambulatory  [] cannot functionally propel manual wheelchair  []  cannot functionally and safely operate scooter/POV [] can safely operate and willing to  [] Stroller Base [] infant/child  [] unable to propel manual wheelchair [] allows for growth [] non-functional ambulator [] non-functional UE [] Indep mobility is not a goal at this time  [] Tilt  [] Forward [] Backward [] Powered tilt  [] Manual tilt  [] change position against gravitational force on head and shoulders  [] change position for pressure relief/cannot weight shift [] transfers  [] management of tone [] rest periods [] control edema [] facilitate postural control  []    [] Recline  [] Power recline on power base [] Manual recline on manual base  [] accommodate femur to back angle  [] bring to full recline for ADL care  [] change position for pressure relief/cannot weight shift [] rest periods [] repositioning for transfers or  clothing/diaper /catheter changes [] head positioning  [x] Lighter weight required [x] self- propulsion  [x] lifting []    [] Heavy Duty required [] user weight greater than 250# [] extreme tone/ over active movement [] broken frame on previous chair []    [x]  Back  []  Angle Adjustable []  Custom molded Tension adjustable [] postural control [] control of tone/spasticity [] accommodation of range of motion [] UE functional control [] accommodation for seating system []   [] provide lateral trunk support [] accommodate deformity [x] provide posterior trunk support [] provide lumbar/sacral support [] support trunk in midline [] Pressure relief over spinal processes  [x]  Seat Cushion General use  [] impaired sensation  [] decubitus ulcers present [] history of pressure ulceration [] prevent pelvic extension [x] low maintenance  [] stabilize pelvis  [] accommodate obliquity [] accommodate multiple deformity [] neutralize lower extremity position [x] increase pressure distribution []    []  Pelvic/thigh support  []  Lateral thigh guide []  Distal medial pad  []  Distal lateral pad []  pelvis in neutral [] accommodate pelvis []  position upper legs []  alignment []  accommodate ROM []  decr adduction [] accommodate tone [] removable for transfers [] decr abduction  []  Lateral trunk Supports []  Lt     []  Rt [] decrease lateral trunk leaning [] control tone [] contour for increased contact [] safety  [] accommodate asymmetry []    []  Mounting hardware  [] lateral trunk supports  [] back   [] seat [] headrest      []  thigh support [] fixed   [] swing away [] attach seat platform/cushion to w/c frame [] attach back cushion to w/c frame [] mount postural supports [] mount  headrest  [] swing medial thigh support away [] swing lateral supports away for transfers  []      Armrests  [] fixed [x] adjustable height [] removable   [] swing away  [x] flip back   [] reclining [x] full length pads [] desk    [] pads tubular  [] provide support with  elbow at 90   [] provide support for w/c tray [] change of height/angles for variable activities [] remove for transfers [x] allow to come closer to table top [] remove for access to tables []    Hangers/ Leg rests  [] 60 [x] 70 [] 90 [] elevating [] heavy duty  [] articulating [] fixed [x] lift off [x] swing away     [] power [x] provide LE support  [] accommodate to hamstring tightness [] elevate legs during recline   [] provide change in position for Legs [] Maintain placement of feet on footplate [] durability [] enable transfers [] decrease edema [] Accommodate lower leg length []    Foot support Footplate    [x] Lt  [x]  Rt  []  Center mount [] flip up     [x] depth/angle adjustable [] Amputee adapter    []  Lt     []  Rt [x] provide foot support [] accommodate to ankle ROM [] transfers [] Provide support for residual extremity []  allow foot to go under wheelchair base []  decrease tone  []    [x]  Ankle strap/heel loops [x] support foot on foot support [] decrease extraneous movement [] provide input to heel  [] protect foot  Tires: [] pneumatic  [x] flat free inserts  [] solid  [x] decrease maintenance  [x] prevent frequent flats [] increase shock absorbency [] decrease pain from road shock [] decrease spasms from road shock []    []  Headrest  [] provide posterior head support [] provide posterior neck support [] provide lateral head support [] provide anterior head support [] support during tilt and recline [] improve feeding   [] improve respiration [] placement of switches [] safety  [] accommodate ROM  [] accommodate tone [] improve visual orientation  []  Anterior chest strap []  Vest []  Shoulder retractors  [] decrease forward movement of shoulder [] accommodation of TLSO [] decrease forward movement of trunk [] decrease shoulder elevation [] added abdominal support [] alignment [] assistance with shoulder control  []    Pelvic Positioner [] Belt [] SubASIS bar [] Dual Pull [] stabilize tone [] decrease falling out of  chair/ **will not Decr potential for sliding due to pelvic tilting [] prevent excessive rotation [] pad for protection over boney prominence [] prominence comfort [] special pull angle to control rotation []    Upper Extremity Support [] L   []  R [] Arm trough    [] hand support []  tray       [] full tray [] swivel mount [] decrease edema      [] decrease subluxation   [] control tone   [] placement for AAC/Computer/EADL [] decrease gravitational pull on shoulders [] provide midline positioning [] provide support to increase UE function [] provide hand support in natural position [] provide work surface  Other: Anti-tippers  For safety when traveling over uneven surfaces and during transportation.    POWER WHEELCHAIR CONTROLS  [] Proportional  [] Non-Proportional Type  [] Left  [] Right [] provides access for controlling wheelchair   [] lacks motor control to operate proportional drive control [] unable to understand proportional controls  Actuator Control Module  [] Single  [] Multiple   [] Allow the client to operate the power seat function(s) through the joystick control   [] Safety Reset Switches [] Used to change modes and stop the wheelchair when driving in latch mode    [] Upgraded Electronics   [] programming for accurate control [] progressive Disease/changing condition [] non-proportional drive control needed [] Needed in order to operate power seat functions through joystick control   [] Display box [] Allows user to see in which mode and drive the wheelchair is set  [] necessary for alternate controls    [] Digital interface electronics [] Allows  w/c to operate when using alternative drive controls  [] ASL Head Array [] Allows client to operate wheelchair  through switches placed in tri-panel headrest  [] Sip and puff with tubing kit [] needed to operate sip and puff drive controls  [] Upgraded tracking electronics [] increase safety when driving [] correct tracking when on uneven surfaces  [] Mount for switches  or joystick [] Attaches switches to w/c  [] Swing away for access or transfers [] midline for optimal placement [] provides for consistent access  [] Attendant controlled joystick plus mount [] safety [] long distance driving [] operation of seat functions [] compliance with transportation regulations []     Rear wheel placement/Axle adjustability [] None [] semi adjustable [] fully adjustable  [] improved UE access to wheels [] improved stability [] changing angle in space for improvement of postural stability [] 1-arm drive access [] amputee pad placement []    Wheel rims/ hand rims  [] metal  [] plastic coated [] oblique projections [] vertical projections [] Provide ability to propel manual wheelchair  []  Increase self-propulsion with hand weakness/decreased grasp  Push handles [] extended  [] angle adjustable  [] standard [] caregiver access [] caregiver assist [] allows "hooking" to enable increased ability to perform ADLs or maintain balance  One armed device  [] Lt   [] Rt [] enable propulsion of manual wheelchair with one arm   []      Brake/wheel lock extension []  Lt   []  Rt [] increase indep in applying wheel locks   [] Side guards [] prevent clothing getting caught in wheel or becoming soiled []  prevent skin tears/abrasions  Battery:  [] to power wheelchair   Other:     The above equipment has a life- long use expectancy. Growth and changes in medical and/or functional conditions would be the exceptions. This is to certify that the therapist has no financial relationship with durable medical provider or manufacturer. The therapist will not receive remuneration of any kind for the equipment recommended in this evaluation.   Patient has mobility limitation that significantly impairs safe, timely participation in one or more mobility related ADL's.  (bathing, toileting, feeding, dressing, grooming, moving from room to room)                                                             [x]  Yes []  No Will mobility  device sufficiently improve ability to participate and/or be aided in participation of MRADL's?         [x]  Yes []  No Can limitation be compensated for with use of a cane or walker?                                                                                []  Yes [x]  No Does patient or caregiver demonstrate ability/potential ability & willingness to safely use the mobility device?   [x]  Yes []  No Does patient's home environment support use of recommended mobility device?                                                    [  x] Yes []  No Does patient have sufficient upper extremity function necessary to functionally propel a manual wheelchair?    [x]  Yes []  No Does patient have sufficient strength and trunk stability to safely operate a POV (scooter)?                       N/A     []  Yes []  No Does patient need additional features/benefits provided by a power wheelchair for MRADL's in the home?       []  Yes [x]  No Does the patient demonstrate the ability to safely use a transport chair?                                                                    [x]  Yes []  No  Therapist Name Printed: Ailene Ravel OTR/L, CBIS Date: 04/17/21  Therapist's Signature:   Date:   Supplier's Name Printed:  Date:   Supplier's Signature:   Date:  Patient/Caregiver Signature:   Date:     This is to certify that I have read this evaluation and do agree with the content within:   24 Name Printed:   22 Signature:  Date:     This is to certify that I, the above signed therapist have the following affiliations: []  This DME provider []  Manufacturer of recommended equipment []  Patient's long term care facility [x]  None of the above       Plan - 04/17/21 1601     OT Occupational Profile and History Detailed Assessment- Review of Records and additional review of physical, cognitive, psychosocial history related to current functional performance    Occupational performance deficits  (Please refer to evaluation for details): ADL's;Leisure    Body Structure / Function / Physical Skills Strength;Balance;ADL    Rehab Potential Excellent    Clinical Decision Making Limited treatment options, no task modification necessary    Comorbidities Affecting Occupational Performance: Presence of comorbidities impacting occupational performance    Comorbidities impacting occupational performance description: See medical chart    Modification or Assistance to Complete Evaluation  No modification of tasks or assist necessary to complete eval    OT Frequency One time visit    OT Treatment/Interventions Other (comment)   Wheelchair evaluation only   Consulted and Agree with Plan of Care Patient;Family member/caregiver    Family Member Consulted daughter             Patient will benefit from skilled therapeutic intervention in order to improve the following deficits and impairments:   Body Structure / Function / Physical Skills: Strength, Balance, ADL       Visit Diagnosis: Other symptoms and signs involving the musculoskeletal system    Problem List Patient Active Problem List   Diagnosis Date Noted   Encounter to establish care 02/21/2021   Atrial fibrillation ([place]) 02/21/2021   Essential hypertension 02/21/2021   OA (osteoarthritis) of knee 02/21/2021   Dementia ([place]) 02/21/2021   Chronic constipation 02/21/2021   Physical deconditioning 02/21/2021    Ailene Ravel, OTR/L,CBIS  [phone]  04/17/2021, 4:02 PM  [facility] [address] [place], [place], [address] Phone: [phone]   Fax:  [phone]  Name: Brittany Hanson MRN: [id number] Date of  Birth: 1931/12/26

## 2021-05-01 DIAGNOSIS — H401134 Primary open-angle glaucoma, bilateral, indeterminate stage: Secondary | ICD-10-CM | POA: Diagnosis not present

## 2021-05-01 DIAGNOSIS — H25813 Combined forms of age-related cataract, bilateral: Secondary | ICD-10-CM | POA: Diagnosis not present

## 2021-05-02 ENCOUNTER — Telehealth: Payer: Self-pay | Admitting: Internal Medicine

## 2021-05-02 ENCOUNTER — Other Ambulatory Visit: Payer: Self-pay | Admitting: *Deleted

## 2021-05-02 MED ORDER — LOSARTAN POTASSIUM 100 MG PO TABS: 100.0000 mg | ORAL_TABLET | Freq: Every day | ORAL | 0 refills | Status: DC

## 2021-05-02 NOTE — Telephone Encounter (Signed)
Medication sent to pharmacy  

## 2021-05-02 NOTE — Telephone Encounter (Signed)
Pt needs refill on   losartan (COZAAR) 100 MG tablet   [facility] , [address]

## 2021-05-07 ENCOUNTER — Other Ambulatory Visit: Payer: Self-pay | Admitting: Internal Medicine

## 2021-05-23 ENCOUNTER — Ambulatory Visit (INDEPENDENT_AMBULATORY_CARE_PROVIDER_SITE_OTHER): Payer: Medicare HMO | Admitting: Internal Medicine

## 2021-05-23 ENCOUNTER — Other Ambulatory Visit: Payer: Self-pay

## 2021-05-23 ENCOUNTER — Encounter: Payer: Self-pay | Admitting: Internal Medicine

## 2021-05-23 VITALS — BP 108/72 | HR 68 | Resp 18 | Ht 65.0 in

## 2021-05-23 DIAGNOSIS — I10 Essential (primary) hypertension: Secondary | ICD-10-CM

## 2021-05-23 DIAGNOSIS — R69 Illness, unspecified: Secondary | ICD-10-CM | POA: Diagnosis not present

## 2021-05-23 DIAGNOSIS — R5381 Other malaise: Secondary | ICD-10-CM | POA: Diagnosis not present

## 2021-05-23 DIAGNOSIS — I480 Paroxysmal atrial fibrillation: Secondary | ICD-10-CM

## 2021-05-23 DIAGNOSIS — G309 Alzheimer's disease, unspecified: Secondary | ICD-10-CM | POA: Diagnosis not present

## 2021-05-23 DIAGNOSIS — F02B0 Dementia in other diseases classified elsewhere, moderate, without behavioral disturbance, psychotic disturbance, mood disturbance, and anxiety: Secondary | ICD-10-CM

## 2021-05-23 NOTE — Patient Instructions (Signed)
Please continue to take medications as prescribed.  Please eat at regular intervals and maintain adequate hydration by taking at least 50 ounces of fluid in a day.

## 2021-05-23 NOTE — Assessment & Plan Note (Signed)
Due to OA, dementia and lack of mobility Dependent for ADLs, daugher and son-in-law have been helping at home She had home PT in the past, does not want it currently  Has a wheelchair currently, but needs a wider wheelchair for better mobility in her home independently, which will improve her quality of life

## 2021-05-23 NOTE — Progress Notes (Signed)
Established Patient Office Visit  Subjective:  Patient ID: Brittany Hanson, female    DOB: [birth year]  Age: 88 y.o. MRN: [id number]  CC:  Chief Complaint  Patient presents with   Follow-up    3 month follow up memory may be getting worse     HPI Brittany Hanson is a 88 y.o. female with past medical history of atrial fibrillation, HTN, Alzheimer's dementia, diffuse OA and physical deconditioning who presents for f/u of her chronic medical conditions.  HTN: BP is well-controlled. Takes medications regularly. Patient denies headache, dizziness, chest pain, dyspnea or palpitations.   Atrial fibrillation: She is followed by cardiology in [place].  She is on metoprolol and Eliquis currently.  She does not have any history of cardiac procedure in the past.  Diffuse OA: She has history of OA of knee and hip.  It appears that she has OA of upper extremity as well. She has had PT eval for wheelchair and is awaiting wheelchair. She has a regular wheelchair for now that her daughter has bought from [employer].  She gets up to use the restroom only, but she is dependent on wheelchair for ambulation at other times.  Alzheimer's dementia: Daughter is concerned about memory decline. She is on donepezil and memantine currently.  She is dependent for her ADLs.  She is able to eat by herself, but daughter has to help prepare the meals, bathing and clothing.  She watches TV during the nighttime and and sleeps during the daytime.  Daughter denies any episodes of agitation, delusion, hallucinations or wandering.   Past Medical History:  Diagnosis Date   Atrial fibrillation (HCC)    Dementia (HCC)    Hypertension    Osteoarthritis     History reviewed. No pertinent surgical history.  History reviewed. No pertinent family history.  Social History   Socioeconomic History   Marital status: Widowed    Spouse name: Not on file   Number of children: Not on file   Years of education: Not on file    Highest education level: Not on file  Occupational History   Not on file  Tobacco Use   Smoking status: Never   Smokeless tobacco: Never  Substance and Sexual Activity   Alcohol use: Not on file   Drug use: Not on file   Sexual activity: Not on file  Other Topics Concern   Not on file  Social History Narrative   Not on file   Social Determinants of Health   Financial Resource Strain: Not on file  Food Insecurity: Not on file  Transportation Needs: Not on file  Physical Activity: Not on file  Stress: Not on file  Social Connections: Not on file  Intimate Partner Violence: Not on file    Outpatient Medications Prior to Visit  Medication Sig Dispense Refill   Acetaminophen (TYLENOL ARTHRITIS PAIN PO) Take by mouth.     donepezil (ARICEPT) 10 MG tablet Take 1 tablet (10 mg total) by mouth at bedtime. 90 tablet 0   ELIQUIS 5 MG TABS tablet Take 5 mg by mouth 2 (two) times daily.     hydrochlorothiazide (HYDRODIURIL) 25 MG tablet Take 1 tablet (25 mg total) by mouth daily. 90 tablet 0   latanoprost (XALATAN) 0.005 % ophthalmic solution 1 drop at bedtime.     losartan (COZAAR) 100 MG tablet Take 1 tablet (100 mg total) by mouth daily. 90 tablet 0   memantine (NAMENDA) 5 MG tablet Take 5 mg by mouth  2 (two) times daily.     metoprolol succinate (TOPROL-XL) 50 MG 24 hr tablet TAKE 1 TABLET BY MOUTH EVERY DAY 90 tablet 0   polyethylene glycol (MIRALAX / GLYCOLAX) 17 g packet Take 17 g by mouth daily.     simvastatin (ZOCOR) 20 MG tablet Take 1 tablet (20 mg total) by mouth at bedtime. 90 tablet 0   No facility-administered medications prior to visit.    No Known Allergies  ROS Review of Systems  Constitutional:  Positive for fatigue. Negative for chills and fever.  HENT:  Negative for congestion, sinus pressure, sinus pain and sore throat.   Eyes:  Negative for pain and discharge.  Respiratory:  Negative for cough and shortness of breath.   Cardiovascular:  Negative for chest  pain and palpitations.  Gastrointestinal:  Positive for constipation. Negative for abdominal pain, diarrhea, nausea and vomiting.  Endocrine: Negative for polydipsia and polyuria.  Genitourinary:  Negative for dysuria and hematuria.  Musculoskeletal:  Positive for arthralgias, back pain and gait problem. Negative for neck pain and neck stiffness.  Skin:  Negative for rash.  Neurological:  Negative for dizziness and weakness.  Psychiatric/Behavioral:  Positive for decreased concentration and sleep disturbance. Negative for agitation and behavioral problems.      Objective:    Physical Exam Vitals reviewed.  Constitutional:      General: She is not in acute distress.    Appearance: She is not diaphoretic.     Comments: In wheelchair  HENT:     Head: Normocephalic and atraumatic.     Mouth/Throat:     Mouth: Mucous membranes are moist.  Eyes:     General: No scleral icterus.    Extraocular Movements: Extraocular movements intact.  Cardiovascular:     Rate and Rhythm: Normal rate. Rhythm irregular.     Pulses: Normal pulses.     Heart sounds: Normal heart sounds. No murmur heard. Pulmonary:     Breath sounds: Normal breath sounds. No wheezing or rales.  Abdominal:     Palpations: Abdomen is soft.     Tenderness: There is no abdominal tenderness.  Musculoskeletal:        General: Swelling (B/l knee) present.     Cervical back: Neck supple. No tenderness.     Right lower leg: No edema.     Left lower leg: No edema.     Comments: ROM limited at b/l knee up to 45 degrees  Skin:    General: Skin is warm.     Findings: No rash.  Neurological:     General: No focal deficit present.     Mental Status: She is alert. Mental status is at baseline.     Motor: Weakness (RLE and LLE - 2/5, RUE and LUE - 2/5) present.     Gait: Gait abnormal (Non-ambulatory mostly).  Psychiatric:        Mood and Affect: Mood normal.        Behavior: Behavior normal.    BP 108/72 (BP Location: Left  Arm, Patient Position: Sitting, Cuff Size: Normal)    Pulse 68    Resp 18    Ht $R'5\' 5"'kI$  (1.651 m)    SpO2 98%    BMI 29.47 kg/m  Wt Readings from Last 3 Encounters:  02/21/21 177 lb 1.9 oz (80.3 kg)    No results found for: TSH No results found for: WBC, HGB, HCT, MCV, PLT No results found for: NA, K, CHLORIDE, CO2, GLUCOSE, BUN, CREATININE, BILITOT,  ALKPHOS, AST, ALT, PROT, ALBUMIN, CALCIUM, ANIONGAP, EGFR, GFR No results found for: CHOL No results found for: HDL No results found for: LDLCALC No results found for: TRIG No results found for: CHOLHDL No results found for: HGBA1C    Assessment & Plan:   Problem List Items Addressed This Visit       Cardiovascular and Mediastinum   Atrial fibrillation ([place])    Rate-controlled with Metoprolol On Eliquis Followed by Cardiology in [place] hypertension - Primary    BP Readings from Last 1 Encounters:  05/23/21 108/72  Well-controlled with Losartan, HCTZ and Metoprolol Counseled for compliance with the medications Advised DASH diet        Nervous and Auditory   Dementia ([place])    On donepezil and memantine A&O X 1 currently, but sleeps at daytime and stays awake during nighttime No episodes of agitation, but does not talk much with other people than her daughter and son-in-law Discussed with daughter that her condition is progressive and she is already on 2 meds for it. Needs to improve on sleeping habit.        Other   Physical deconditioning    Due to OA, dementia and lack of mobility Dependent for ADLs, daugher and son-in-law have been helping at home She had home PT in the past, does not want it currently  Has a wheelchair currently, but needs a wider wheelchair for better mobility in her home independently, which will improve her quality of life        No orders of the defined types were placed in this encounter.   Follow-up: Return in about 6 months (around 11/20/2021) for Annual physical.     Lindell Spar, MD

## 2021-05-23 NOTE — Assessment & Plan Note (Signed)
Rate-controlled with Metoprolol On Eliquis Followed by Cardiology in [place] 

## 2021-05-23 NOTE — Assessment & Plan Note (Signed)
BP Readings from Last 1 Encounters:  05/23/21 108/72   Well-controlled with Losartan, HCTZ and Metoprolol Counseled for compliance with the medications Advised DASH diet

## 2021-05-23 NOTE — Assessment & Plan Note (Signed)
On donepezil and memantine A&O X 1 currently, but sleeps at daytime and stays awake during nighttime No episodes of agitation, but does not talk much with other people than her daughter and son-in-law Discussed with daughter that her condition is progressive and she is already on 2 meds for it. Needs to improve on sleeping habit.

## 2021-05-27 ENCOUNTER — Other Ambulatory Visit: Payer: Self-pay | Admitting: Internal Medicine

## 2021-05-29 ENCOUNTER — Other Ambulatory Visit: Payer: Self-pay | Admitting: Internal Medicine

## 2021-06-10 ENCOUNTER — Other Ambulatory Visit: Payer: Self-pay | Admitting: Internal Medicine

## 2021-06-11 ENCOUNTER — Other Ambulatory Visit: Payer: Self-pay

## 2021-06-11 ENCOUNTER — Ambulatory Visit (INDEPENDENT_AMBULATORY_CARE_PROVIDER_SITE_OTHER): Payer: Medicare HMO

## 2021-06-11 DIAGNOSIS — Z0000 Encounter for general adult medical examination without abnormal findings: Secondary | ICD-10-CM | POA: Diagnosis not present

## 2021-06-11 DIAGNOSIS — Z780 Asymptomatic menopausal state: Secondary | ICD-10-CM

## 2021-06-11 NOTE — Progress Notes (Addendum)
Subjective:   Brittany Hanson is a 88 y.o. female who presents for Medicare Annual (Subsequent) preventive examination. I connected with  Brittany Hanson on 06/11/21 by a audio enabled telemedicine application and verified that I am speaking with the correct person using two identifiers.  Patient Location: Home  Provider Location: Office/Clinic  I discussed the limitations of evaluation and management by telemedicine. The patient expressed understanding and agreed to proceed.  These are the goals we discussed:  Goals   None     This is a list of the screening recommended for you and due dates:  Health Maintenance  Topic Date Due   COVID-19 Vaccine (1) Never done   Tetanus Vaccine  Never done   Zoster (Shingles) Vaccine (1 of 2) Never done   Pneumonia Vaccine (1 - PCV) Never done   DEXA scan (bone density measurement)  Never done   Flu Shot  08/09/2021*   HPV Vaccine  Aged Out  *Topic was postponed. The date shown is not the original due date.    Review of Systems    Defer to PCP Cardiac Risk Factors include: advanced age (>75men, >46 women);hypertension;sedentary lifestyle     Objective:    There were no vitals filed for this visit. There is no height or weight on file to calculate BMI.  Advanced Directives 06/11/2021  Does Patient Have a Medical Advance Directive? Yes  Type of Advance Directive Healthcare Power of Attorney  Copy of Healthcare Power of Attorney in Chart? No - copy requested    Current Medications (verified) Outpatient Encounter Medications as of 06/11/2021  Medication Sig   Acetaminophen (TYLENOL ARTHRITIS PAIN PO) Take by mouth.   donepezil (ARICEPT) 10 MG tablet Take 1 tablet (10 mg total) by mouth at bedtime.   ELIQUIS 5 MG TABS tablet Take 5 mg by mouth 2 (two) times daily.   hydrochlorothiazide (HYDRODIURIL) 25 MG tablet Take 1 tablet (25 mg total) by mouth daily.   latanoprost (XALATAN) 0.005 % ophthalmic solution 1 drop at bedtime.    losartan (COZAAR) 100 MG tablet TAKE 1 TABLET BY MOUTH EVERY DAY   memantine (NAMENDA) 5 MG tablet Take 5 mg by mouth 2 (two) times daily.   metoprolol succinate (TOPROL-XL) 50 MG 24 hr tablet TAKE 1 TABLET BY MOUTH EVERY DAY   polyethylene glycol (MIRALAX / GLYCOLAX) 17 g packet Take 17 g by mouth daily.   simvastatin (ZOCOR) 20 MG tablet TAKE 1 TABLET BY MOUTH EVERYDAY AT BEDTIME   No facility-administered encounter medications on file as of 06/11/2021.    Allergies (verified) Patient has no known allergies.   History: Past Medical History:  Diagnosis Date   Atrial fibrillation (HCC)    Dementia (HCC)    Hypertension    Osteoarthritis    History reviewed. No pertinent surgical history. Family History  Problem Relation Age of Onset   Diabetes Sister    Cancer Daughter    Social History   Socioeconomic History   Marital status: Widowed    Spouse name: Not on file   Number of children: 3   Years of education: 6th   Highest education level: Not on file  Occupational History   Not on file  Tobacco Use   Smoking status: Never   Smokeless tobacco: Never  Vaping Use   Vaping Use: Never used  Substance and Sexual Activity   Alcohol use: Never   Drug use: Never   Sexual activity: Not Currently  Other Topics Concern  Not on file  Social History Narrative   Not on file   Social Determinants of Health   Financial Resource Strain: Not on file  Food Insecurity: Not on file  Transportation Needs: Not on file  Physical Activity: Not on file  Stress: Not on file  Social Connections: Not on file    Tobacco Counseling Counseling given: Not Answered   Clinical Intake:  Pre-visit preparation completed: No  Pain : No/denies pain     Nutritional Risks: None Diabetes: No  How often do you need to have someone help you when you read instructions, pamphlets, or other written materials from your doctor or pharmacy?: 3 - Sometimes What is the last grade level you  completed in school?: 6th  Diabetic?no  Interpreter Needed?: No  Information entered by :: Brittany Hanson   Activities of Daily Living In your present state of health, do you have any difficulty performing the following activities: 06/11/2021 02/21/2021  Hearing? N Y  Vision? N N  Difficulty concentrating or making decisions? Y N  Walking or climbing stairs? Y Y  Dressing or bathing? Y Y  Doing errands, shopping? Malvin Johns  Preparing Food and eating ? Y -  Using the Toilet? N -  In the past six months, have you accidently leaked urine? Y -  Do you have problems with loss of bowel control? N -  Managing your Medications? Y -  Managing your Finances? Y -  Housekeeping or managing your Housekeeping? Y -  Some recent data might be hidden    Patient Care Team: Anabel Halon, MD as PCP - General (Internal Medicine)  Indicate any recent Medical Services you may have received from other than [facility] providers in the past year (date may be approximate).     Assessment:   This is a routine wellness examination for Brittany Hanson.  Hearing/Vision screen No results found.  Dietary issues and exercise activities discussed: Current Exercise Habits: The patient does not participate in regular exercise at present, Exercise limited by: cardiac condition(s);orthopedic condition(s)   Goals Addressed   None   Depression Screen PHQ 2/9 Scores 06/11/2021 05/23/2021 02/21/2021  PHQ - 2 Score 0 0 0    Fall Risk Fall Risk  06/11/2021 05/23/2021 02/21/2021  Falls in the past year? - 0 0  Number falls in past yr: 0 0 0  Injury with Fall? 0 0 0  Risk for fall due to : No Fall Risks History of fall(s);Impaired mobility No Fall Risks  Follow up Falls evaluation completed Falls evaluation completed;Falls prevention discussed Falls evaluation completed    FALL RISK PREVENTION PERTAINING TO THE HOME:  Any stairs in or around the home? Yes  If so, are there any without handrails? Yes  Home free of loose throw rugs  in walkways, pet beds, electrical cords, etc? Yes  Adequate lighting in your home to reduce risk of falls? Yes   ASSISTIVE DEVICES UTILIZED TO PREVENT FALLS:  Life alert? No  Use of a cane, walker or w/c? Yes  Grab bars in the bathroom? Yes  Shower chair or bench in shower? No  Elevated toilet seat or a handicapped toilet? Yes     Cognitive Function:        Immunizations  There is no immunization history on file for this patient.  TDAP status: Due, Education has been provided regarding the importance of this vaccine. Advised may receive this vaccine at local pharmacy or Health Dept. Aware to provide a copy of the vaccination  record if obtained from local pharmacy or Health Dept. Verbalized acceptance and understanding.  Flu Vaccine status: Due, Education has been provided regarding the importance of this vaccine. Advised may receive this vaccine at local pharmacy or Health Dept. Aware to provide a copy of the vaccination record if obtained from local pharmacy or Health Dept. Verbalized acceptance and understanding.  Pneumococcal vaccine status: Due, Education has been provided regarding the importance of this vaccine. Advised may receive this vaccine at local pharmacy or Health Dept. Aware to provide a copy of the vaccination record if obtained from local pharmacy or Health Dept. Verbalized acceptance and understanding.  Covid-19 vaccine status: Information provided on how to obtain vaccines.   Qualifies for Shingles Vaccine? Yes   Zostavax completed No   Shingrix Completed?: No.    Education has been provided regarding the importance of this vaccine. Patient has been advised to call insurance company to determine out of pocket expense if they have not yet received this vaccine. Advised may also receive vaccine at local pharmacy or Health Dept. Verbalized acceptance and understanding.  Screening Tests Health Maintenance  Topic Date Due   COVID-19 Vaccine (1) Never done    TETANUS/TDAP  Never done   Zoster Vaccines- Shingrix (1 of 2) Never done   Pneumonia Vaccine 2065+ Years old (1 - PCV) Never done   DEXA SCAN  Never done   INFLUENZA VACCINE  08/09/2021 (Originally 12/10/2020)   HPV VACCINES  Aged Out    Health Maintenance  Health Maintenance Due  Topic Date Due   COVID-19 Vaccine (1) Never done   TETANUS/TDAP  Never done   Zoster Vaccines- Shingrix (1 of 2) Never done   Pneumonia Vaccine 8165+ Years old (1 - PCV) Never done   DEXA SCAN  Never done    Colorectal cancer screening: No longer required.   Mammogram status: No longer required due to age.  Bone Density status: Ordered 06/11/2021. Pt provided with contact info and advised to call to schedule appt.  Lung Cancer Screening: (Low Dose CT Chest recommended if Age 45-80 years, 30 pack-year currently smoking OR have quit w/in 15years.) does not qualify.   Lung Cancer Screening Referral: n/a  Additional Screening:  Hepatitis C Screening: does not qualify; Completed not at high risk  Vision Screening: Recommended annual ophthalmology exams for early detection of glaucoma and other disorders of the eye. Is the patient up to date with their annual eye exam?  Yes  Who is the provider or what is the name of the office in which the patient attends annual eye exams? Dr Eston Mouldoddy in [place] If pt is not established with a provider, would they like to be referred to a provider to establish care? No .   Dental Screening: Recommended annual dental exams for proper oral hygiene  Community Resource Referral / Chronic Care Management: CRR required this visit?  No   CCM required this visit?  No      Plan:     I have personally reviewed and noted the following in the patients chart:   Medical and social history Use of alcohol, tobacco or illicit drugs  Current medications and supplements including opioid prescriptions.  Functional ability and status Nutritional status Physical activity Advanced  directives List of other physicians Hospitalizations, surgeries, and ER visits in previous 12 months Vitals Screenings to include cognitive, depression, and falls Referrals and appointments  In addition, I have reviewed and discussed with patient certain preventive protocols, quality metrics, and best practice  recommendations. A written personalized care plan for preventive services as well as general preventive health recommendations were provided to patient.     [place]endale ChardSherry D Konica Stankowski, CMA   06/11/2021   Nurse Notes:  Ms. Wilhelmenia BlaseFreeze , Thank you for taking time to come for your Medicare Wellness Visit. I appreciate your ongoing commitment to your health goals. Please review the following plan we discussed and let me know if I can assist you in the future.   These are the goals we discussed:  Goals   None     This is a list of the screening recommended for you and due dates:  Health Maintenance  Topic Date Due   COVID-19 Vaccine (1) Never done   Tetanus Vaccine  Never done   Zoster (Shingles) Vaccine (1 of 2) Never done   Pneumonia Vaccine (1 - PCV) Never done   DEXA scan (bone density measurement)  Never done   Flu Shot  08/09/2021*   HPV Vaccine  Aged Out  *Topic was postponed. The date shown is not the original due date.

## 2021-06-11 NOTE — Patient Instructions (Signed)

## 2021-06-19 ENCOUNTER — Other Ambulatory Visit: Payer: Self-pay | Admitting: Internal Medicine

## 2021-07-23 ENCOUNTER — Telehealth: Payer: Self-pay

## 2021-07-23 NOTE — Telephone Encounter (Signed)
Patient daughter Bonita Quin called about wheelchair at [employer], waiting on authorization from provider before releasing wheelchair.   ?

## 2021-07-23 NOTE — Telephone Encounter (Signed)
Pt advised this had been sent back to [facility] she stated wheelchair was being delivered on Friday of this week let her know to reach out if there was any other issues  ?

## 2021-07-26 DIAGNOSIS — G309 Alzheimer's disease, unspecified: Secondary | ICD-10-CM | POA: Diagnosis not present

## 2021-07-26 DIAGNOSIS — M170 Bilateral primary osteoarthritis of knee: Secondary | ICD-10-CM | POA: Diagnosis not present

## 2021-07-26 DIAGNOSIS — Z723 Lack of physical exercise: Secondary | ICD-10-CM | POA: Diagnosis not present

## 2021-08-02 ENCOUNTER — Telehealth: Payer: Self-pay

## 2021-08-02 ENCOUNTER — Other Ambulatory Visit: Payer: Self-pay | Admitting: *Deleted

## 2021-08-02 MED ORDER — ELIQUIS 5 MG PO TABS: 5.0000 mg | ORAL_TABLET | Freq: Two times a day (BID) | ORAL | 0 refills | Status: DC

## 2021-08-02 NOTE — Telephone Encounter (Signed)
Pt medication sent to pharmacy  

## 2021-08-02 NOTE — Telephone Encounter (Signed)
Going out of town can she get her ELIQUIS 5 MG TABS tablet before she leaves out of town,  ? ?Ph[facility]acy: [facility].  ?

## 2021-08-11 ENCOUNTER — Encounter (HOSPITAL_COMMUNITY): Payer: Self-pay | Admitting: Emergency Medicine

## 2021-08-11 ENCOUNTER — Other Ambulatory Visit: Payer: Self-pay

## 2021-08-11 ENCOUNTER — Inpatient Hospital Stay (HOSPITAL_COMMUNITY)
Admission: EM | Admit: 2021-08-11 | Discharge: 2021-08-16 | DRG: 394 | Disposition: A | Discharge status: Home Health | Payer: Medicare HMO | Attending: Internal Medicine | Admitting: Internal Medicine

## 2021-08-11 DIAGNOSIS — R7989 Other specified abnormal findings of blood chemistry: Secondary | ICD-10-CM

## 2021-08-11 DIAGNOSIS — D62 Acute posthemorrhagic anemia: Secondary | ICD-10-CM | POA: Diagnosis present

## 2021-08-11 DIAGNOSIS — F02B0 Dementia in other diseases classified elsewhere, moderate, without behavioral disturbance, psychotic disturbance, mood disturbance, and anxiety: Secondary | ICD-10-CM | POA: Diagnosis present

## 2021-08-11 DIAGNOSIS — T45515A Adverse effect of anticoagulants, initial encounter: Secondary | ICD-10-CM | POA: Diagnosis present

## 2021-08-11 DIAGNOSIS — Z833 Family history of diabetes mellitus: Secondary | ICD-10-CM

## 2021-08-11 DIAGNOSIS — K625 Hemorrhage of anus and rectum: Secondary | ICD-10-CM | POA: Diagnosis present

## 2021-08-11 DIAGNOSIS — K922 Gastrointestinal hemorrhage, unspecified: Secondary | ICD-10-CM | POA: Diagnosis not present

## 2021-08-11 DIAGNOSIS — K644 Residual hemorrhoidal skin tags: Secondary | ICD-10-CM | POA: Diagnosis present

## 2021-08-11 DIAGNOSIS — K6289 Other specified diseases of anus and rectum: Secondary | ICD-10-CM | POA: Diagnosis not present

## 2021-08-11 DIAGNOSIS — K449 Diaphragmatic hernia without obstruction or gangrene: Secondary | ICD-10-CM | POA: Diagnosis present

## 2021-08-11 DIAGNOSIS — M1990 Unspecified osteoarthritis, unspecified site: Secondary | ICD-10-CM | POA: Diagnosis present

## 2021-08-11 DIAGNOSIS — Z91199 Patient's noncompliance with other medical treatment and regimen due to unspecified reason: Secondary | ICD-10-CM

## 2021-08-11 DIAGNOSIS — K648 Other hemorrhoids: Secondary | ICD-10-CM | POA: Diagnosis not present

## 2021-08-11 DIAGNOSIS — K5289 Other specified noninfective gastroenteritis and colitis: Secondary | ICD-10-CM | POA: Diagnosis present

## 2021-08-11 DIAGNOSIS — K5900 Constipation, unspecified: Secondary | ICD-10-CM | POA: Diagnosis present

## 2021-08-11 DIAGNOSIS — D6832 Hemorrhagic disorder due to extrinsic circulating anticoagulants: Secondary | ICD-10-CM | POA: Diagnosis present

## 2021-08-11 DIAGNOSIS — Z7901 Long term (current) use of anticoagulants: Secondary | ICD-10-CM

## 2021-08-11 DIAGNOSIS — N179 Acute kidney failure, unspecified: Secondary | ICD-10-CM | POA: Diagnosis present

## 2021-08-11 DIAGNOSIS — F0390 Unspecified dementia without behavioral disturbance: Secondary | ICD-10-CM | POA: Diagnosis present

## 2021-08-11 DIAGNOSIS — I10 Essential (primary) hypertension: Secondary | ICD-10-CM | POA: Diagnosis present

## 2021-08-11 DIAGNOSIS — K5904 Chronic idiopathic constipation: Secondary | ICD-10-CM | POA: Diagnosis present

## 2021-08-11 DIAGNOSIS — I4891 Unspecified atrial fibrillation: Secondary | ICD-10-CM | POA: Diagnosis present

## 2021-08-11 DIAGNOSIS — D519 Vitamin B12 deficiency anemia, unspecified: Secondary | ICD-10-CM | POA: Diagnosis present

## 2021-08-11 DIAGNOSIS — K5909 Other constipation: Secondary | ICD-10-CM | POA: Diagnosis present

## 2021-08-11 DIAGNOSIS — E860 Dehydration: Secondary | ICD-10-CM | POA: Diagnosis present

## 2021-08-11 DIAGNOSIS — Z79899 Other long term (current) drug therapy: Secondary | ICD-10-CM

## 2021-08-11 DIAGNOSIS — I482 Chronic atrial fibrillation, unspecified: Secondary | ICD-10-CM | POA: Diagnosis present

## 2021-08-11 DIAGNOSIS — D509 Iron deficiency anemia, unspecified: Secondary | ICD-10-CM

## 2021-08-11 NOTE — ED Triage Notes (Signed)
Family reports dark stools since last night  ?

## 2021-08-12 ENCOUNTER — Encounter (HOSPITAL_COMMUNITY): Payer: Self-pay | Admitting: Family Medicine

## 2021-08-12 ENCOUNTER — Emergency Department (HOSPITAL_COMMUNITY): Payer: Medicare HMO

## 2021-08-12 DIAGNOSIS — I4821 Permanent atrial fibrillation: Secondary | ICD-10-CM

## 2021-08-12 DIAGNOSIS — G309 Alzheimer's disease, unspecified: Secondary | ICD-10-CM

## 2021-08-12 DIAGNOSIS — R7989 Other specified abnormal findings of blood chemistry: Secondary | ICD-10-CM | POA: Diagnosis not present

## 2021-08-12 DIAGNOSIS — I10 Essential (primary) hypertension: Secondary | ICD-10-CM | POA: Diagnosis not present

## 2021-08-12 DIAGNOSIS — K5909 Other constipation: Secondary | ICD-10-CM | POA: Diagnosis not present

## 2021-08-12 DIAGNOSIS — R69 Illness, unspecified: Secondary | ICD-10-CM | POA: Diagnosis not present

## 2021-08-12 DIAGNOSIS — K6289 Other specified diseases of anus and rectum: Secondary | ICD-10-CM | POA: Diagnosis not present

## 2021-08-12 DIAGNOSIS — K922 Gastrointestinal hemorrhage, unspecified: Secondary | ICD-10-CM | POA: Diagnosis not present

## 2021-08-12 DIAGNOSIS — F02B0 Dementia in other diseases classified elsewhere, moderate, without behavioral disturbance, psychotic disturbance, mood disturbance, and anxiety: Secondary | ICD-10-CM

## 2021-08-12 DIAGNOSIS — K625 Hemorrhage of anus and rectum: Secondary | ICD-10-CM | POA: Diagnosis not present

## 2021-08-12 LAB — CBC
HCT: 27.7 % — ABNORMAL LOW (ref 36.0–46.0)
HCT: 28 % — ABNORMAL LOW (ref 36.0–46.0)
HCT: 31 % — ABNORMAL LOW (ref 36.0–46.0)
HCT: 31.7 % — ABNORMAL LOW (ref 36.0–46.0)
Hemoglobin: 8.2 g/dL — ABNORMAL LOW (ref 12.0–15.0)
Hemoglobin: 8.4 g/dL — ABNORMAL LOW (ref 12.0–15.0)
Hemoglobin: 9.3 g/dL — ABNORMAL LOW (ref 12.0–15.0)
Hemoglobin: 9.4 g/dL — ABNORMAL LOW (ref 12.0–15.0)
MCH: 24.7 pg — ABNORMAL LOW (ref 26.0–34.0)
MCH: 25.2 pg — ABNORMAL LOW (ref 26.0–34.0)
MCH: 25.5 pg — ABNORMAL LOW (ref 26.0–34.0)
MCH: 25.8 pg — ABNORMAL LOW (ref 26.0–34.0)
MCHC: 29.3 g/dL — ABNORMAL LOW (ref 30.0–36.0)
MCHC: 29.7 g/dL — ABNORMAL LOW (ref 30.0–36.0)
MCHC: 30 g/dL (ref 30.0–36.0)
MCHC: 30.3 g/dL (ref 30.0–36.0)
MCV: 84.3 fL (ref 80.0–100.0)
MCV: 84.9 fL (ref 80.0–100.0)
MCV: 85 fL (ref 80.0–100.0)
MCV: 85.2 fL (ref 80.0–100.0)
Platelets: 196 10*3/uL (ref 150–400)
Platelets: 208 10*3/uL (ref 150–400)
Platelets: 261 10*3/uL (ref 150–400)
Platelets: 262 10*3/uL (ref 150–400)
RBC: 3.25 MIL/uL — ABNORMAL LOW (ref 3.87–5.11)
RBC: 3.32 MIL/uL — ABNORMAL LOW (ref 3.87–5.11)
RBC: 3.65 MIL/uL — ABNORMAL LOW (ref 3.87–5.11)
RBC: 3.73 MIL/uL — ABNORMAL LOW (ref 3.87–5.11)
RDW: 16.2 % — ABNORMAL HIGH (ref 11.5–15.5)
RDW: 16.3 % — ABNORMAL HIGH (ref 11.5–15.5)
RDW: 16.4 % — ABNORMAL HIGH (ref 11.5–15.5)
RDW: 16.5 % — ABNORMAL HIGH (ref 11.5–15.5)
WBC: 11.2 10*3/uL — ABNORMAL HIGH (ref 4.0–10.5)
WBC: 6.3 10*3/uL (ref 4.0–10.5)
WBC: 8.4 10*3/uL (ref 4.0–10.5)
WBC: 8.6 10*3/uL (ref 4.0–10.5)
nRBC: 0 % (ref 0.0–0.2)
nRBC: 0 % (ref 0.0–0.2)
nRBC: 0 % (ref 0.0–0.2)
nRBC: 0 % (ref 0.0–0.2)

## 2021-08-12 LAB — RETICULOCYTES
Immature Retic Fract: 21.5 % — ABNORMAL HIGH (ref 2.3–15.9)
RBC.: 3.75 MIL/uL — ABNORMAL LOW (ref 3.87–5.11)
Retic Count, Absolute: 58.9 10*3/uL (ref 19.0–186.0)
Retic Ct Pct: 1.6 % (ref 0.4–3.1)

## 2021-08-12 LAB — IRON AND TIBC
Iron: 24 ug/dL — ABNORMAL LOW (ref 28–170)
Saturation Ratios: 6 % — ABNORMAL LOW (ref 10.4–31.8)
TIBC: 436 ug/dL (ref 250–450)
UIBC: 412 ug/dL

## 2021-08-12 LAB — FERRITIN: Ferritin: 10 ng/mL — ABNORMAL LOW (ref 11–307)

## 2021-08-12 LAB — BASIC METABOLIC PANEL
Anion gap: 11 (ref 5–15)
BUN: 45 mg/dL — ABNORMAL HIGH (ref 8–23)
CO2: 24 mmol/L (ref 22–32)
Calcium: 9.7 mg/dL (ref 8.9–10.3)
Chloride: 103 mmol/L (ref 98–111)
Creatinine, Ser: 1.85 mg/dL — ABNORMAL HIGH (ref 0.44–1.00)
GFR, Estimated: 26 mL/min — ABNORMAL LOW (ref >60)
Glucose, Bld: 142 mg/dL — ABNORMAL HIGH (ref 70–99)
Potassium: 4.3 mmol/L (ref 3.5–5.1)
Sodium: 138 mmol/L (ref 135–145)

## 2021-08-12 LAB — TYPE AND SCREEN
ABO/RH(D): O POS
Antibody Screen: NEGATIVE

## 2021-08-12 LAB — HEPATIC FUNCTION PANEL
ALT: 14 U/L (ref 0–44)
AST: 22 U/L (ref 15–41)
Albumin: 4.2 g/dL (ref 3.5–5.0)
Alkaline Phosphatase: 72 U/L (ref 38–126)
Bilirubin, Direct: 0.1 mg/dL (ref 0.0–0.2)
Indirect Bilirubin: 0.4 mg/dL (ref 0.3–0.9)
Total Bilirubin: 0.5 mg/dL (ref 0.3–1.2)
Total Protein: 6.9 g/dL (ref 6.5–8.1)

## 2021-08-12 LAB — PROTIME-INR
INR: 1.7 — ABNORMAL HIGH (ref 0.8–1.2)
Prothrombin Time: 19.8 seconds — ABNORMAL HIGH (ref 11.4–15.2)

## 2021-08-12 LAB — VITAMIN B12: Vitamin B-12: 145 pg/mL — ABNORMAL LOW (ref 180–914)

## 2021-08-12 LAB — FOLATE: Folate: 31.8 ng/mL (ref >5.9)

## 2021-08-12 MED ORDER — POLYETHYLENE GLYCOL 3350 17 G PO PACK
17.0000 g | PACK | Freq: Two times a day (BID) | ORAL | Status: DC
  Administered 2021-08-12 – 2021-08-13 (×3): 17 g via ORAL
  Filled 2021-08-12 (×8): qty 1

## 2021-08-12 MED ORDER — ACETAMINOPHEN 325 MG PO TABS
650.0000 mg | ORAL_TABLET | Freq: Four times a day (QID) | ORAL | Status: DC | PRN
  Administered 2021-08-16: 650 mg via ORAL
  Filled 2021-08-12: qty 2

## 2021-08-12 MED ORDER — IOHEXOL 9 MG/ML PO SOLN
ORAL | Status: AC
  Filled 2021-08-12: qty 1000

## 2021-08-12 MED ORDER — SIMVASTATIN 20 MG PO TABS
20.0000 mg | ORAL_TABLET | Freq: Every day | ORAL | Status: DC
  Administered 2021-08-12 – 2021-08-15 (×4): 20 mg via ORAL
  Filled 2021-08-12 (×4): qty 1

## 2021-08-12 MED ORDER — ONDANSETRON HCL 4 MG PO TABS: 4.0000 mg | ORAL_TABLET | Freq: Four times a day (QID) | ORAL | Status: DC | PRN

## 2021-08-12 MED ORDER — PANTOPRAZOLE SODIUM 40 MG IV SOLR
40.0000 mg | Freq: Two times a day (BID) | INTRAVENOUS | Status: DC
  Administered 2021-08-12 – 2021-08-15 (×8): 40 mg via INTRAVENOUS
  Filled 2021-08-12 (×8): qty 10

## 2021-08-12 MED ORDER — DONEPEZIL HCL 5 MG PO TABS
10.0000 mg | ORAL_TABLET | Freq: Every day | ORAL | Status: DC
  Administered 2021-08-12 – 2021-08-15 (×4): 10 mg via ORAL
  Filled 2021-08-12 (×4): qty 2

## 2021-08-12 MED ORDER — OXYCODONE HCL 5 MG PO TABS: 5.0000 mg | ORAL_TABLET | ORAL | Status: DC | PRN

## 2021-08-12 MED ORDER — POLYETHYLENE GLYCOL 3350 17 G PO PACK
17.0000 g | PACK | Freq: Every day | ORAL | Status: DC
  Administered 2021-08-12: 17 g via ORAL
  Filled 2021-08-12: qty 1

## 2021-08-12 MED ORDER — ACETAMINOPHEN 650 MG RE SUPP: 650.0000 mg | Freq: Four times a day (QID) | RECTAL | Status: DC | PRN

## 2021-08-12 MED ORDER — HYDROCORTISONE ACETATE 25 MG RE SUPP
25.0000 mg | Freq: Two times a day (BID) | RECTAL | Status: DC
  Administered 2021-08-12 – 2021-08-14 (×5): 25 mg via RECTAL
  Filled 2021-08-12 (×9): qty 1

## 2021-08-12 MED ORDER — SODIUM CHLORIDE 0.9 % IV SOLN: INTRAVENOUS | Status: DC

## 2021-08-12 MED ORDER — BISACODYL 10 MG RE SUPP
10.0000 mg | Freq: Once | RECTAL | Status: AC
  Administered 2021-08-12: 10 mg via RECTAL
  Filled 2021-08-12: qty 1

## 2021-08-12 MED ORDER — MEMANTINE HCL 10 MG PO TABS
5.0000 mg | ORAL_TABLET | Freq: Two times a day (BID) | ORAL | Status: DC
  Administered 2021-08-12 – 2021-08-16 (×9): 5 mg via ORAL
  Filled 2021-08-12 (×9): qty 1

## 2021-08-12 MED ORDER — MORPHINE SULFATE (PF) 2 MG/ML IV SOLN: 2.0000 mg | INTRAVENOUS | Status: DC | PRN

## 2021-08-12 MED ORDER — ONDANSETRON HCL 4 MG/2ML IJ SOLN: 4.0000 mg | Freq: Four times a day (QID) | INTRAMUSCULAR | Status: DC | PRN

## 2021-08-12 NOTE — Assessment & Plan Note (Signed)
Creatinine is elevated at 1.85 ?-No comparison as patient usually gets care in [place] ?-With an elevated BUN as well, assuming dehydration and elevation from baseline of creatinine ?-Continue gentle IV hydration ?-Avoid nephrotoxic agents when possible ?-Trend in the a.m. ?

## 2021-08-12 NOTE — Progress Notes (Signed)
Brittany Hanson is a 88 y.o. female with medical history significant of with history of atrial fibrillation, dementia, hypertension who presented to ED with a chief complaint of blood per rectum.  Patient seen and examined.  She was sitting at the edge of the bed.  She was fully alert and partly oriented.  When I asked her if she knew why she was brought to the emergency department, she said " because I am crazy", when asked her again then she said " because of my bowel" she could not come up with the presenting complaint.  She did confirm that she had blood with a bowel movement yesterday but she has not had any since admission.  GI has been consulted.  Her hemoglobin dropped from 9.4 yesterday to 8.2 today.  No indication of transfusion she is hemodynamically stable as well.  GI offered colonoscopy but patient declined.  They will reach out to the family.  However they do believe that patient had rectal bleeding likely secondary to constipation related to stercoral colitis and internal hemorrhoids.  They examined her as well.  We will monitor her and hope that they will be able to reach family and do colonoscopy on her if there plans .  We will monitor her overnight.  Continue to hold Eliquis ?

## 2021-08-12 NOTE — Assessment & Plan Note (Signed)
Hold Eliquis given rectal bleeding ?-Continue metoprolol ?

## 2021-08-12 NOTE — TOC Initial Note (Signed)
Transition of Care (TOC) - Initial/Assessment Note  ? ? ?Patient Details  ?Name: Brittany Hanson ?MRN: [id number] ?Date of Birth: April 08, 1932 ? ?Transition of Care (TOC) CM/SW Contact:    ?Hetty Ely, RN ?Phone Number: ?08/12/2021, 2:54 PM ? ?Clinical Narrative:   TOCRN spoke with patients daughter/POA Brittany Hanson who is at the bedside. Daughter says patient lives with her and her husband now for two years and she is POA. Daughter says patient is [facility] and has dementia, therefore she provides total care. Patient ambulates with walker with assistance when she needs to use BR, other time she sits in a recliner. Had HHPT, however declined to work with them, daughter unable to remember name of agency. [facility] [address] [place]. Daughter declines SNF at this time prefers to take patient back home to care for her. TOC to continue to track and assist as needed. ? ? ?Expected Discharge Plan: Home/Self Care ?Barriers to Discharge: Continued Medical Work up ? ? ?Patient Goals and CMS Choice ?Patient states their goals for this hospitalization and ongoing recovery are:: Patient with Dementia lives with Daughter/POA Brittany Hanson and husband. ?  ?Choice offered to / list presented to : [facility] POA / Guardian (Daughter declines SNF) ? ?Expected Discharge Plan and Services ?Expected Discharge Plan: Home/Self Care ?  ?  ?  ?Living arrangements for the past 2 months: Single Family Home ?                ?  ?  ?  ?  ?  ?  ?  ?  ?  ?  ? ?Prior Living Arrangements/Services ?Living arrangements for the past 2 months: Single Family Home ?Lives with:: Adult Children ?Patient language and need for interpreter reviewed:: Yes ?Do you feel safe going back to the place where you live?:  (POA in room provided information about patient.)      ?Need for Family Participation in Patient Care: Yes (Comment) ?Care giver support system in place?: Yes (comment) ?  ?Criminal Activity/Legal Involvement Pertinent to Current  Situation/Hospitalization: No - Comment as needed ? ?Activities of Daily Living ?  ?  ? ?Permission Sought/Granted ?  ?  ?   ?   ?   ?   ? ?Emotional Assessment ?Appearance:: Appears stated age ?Attitude/Demeanor/Rapport: Unable to Assess ?Affect (typically observed): Unable to Assess ?Orientation: : Oriented to Self ?Alcohol / Substance Use: Not Applicable ?Psych Involvement: No (comment) ? ?Admission diagnosis:  GI bleed [K92.2] ?Lower GI bleed [K92.2] ?Patient Active Problem List  ? Diagnosis Date Noted  ? GI bleed 08/12/2021  ? Elevated serum creatinine 08/12/2021  ? Atrial fibrillation (HCC) 02/21/2021  ? Essential hypertension 02/21/2021  ? OA (osteoarthritis) of knee 02/21/2021  ? Dementia (HCC) 02/21/2021  ? Chronic constipation 02/21/2021  ? Physical deconditioning 02/21/2021  ? ?PCP:  Anabel Halon, MD ?Pharmacy:   ?[id number] [place] [place] - [address] [facility] ?[address] ?[place] [address] ?Phone: [phone] Fax: [phone] ? ? ? ? ?Social Determinants of Health (SDOH) Interventions ?  ? ?Readmission Risk Interventions ?   ? View : No data to display.  ?  ?  ?  ? ? ? ?

## 2021-08-12 NOTE — ED Provider Notes (Signed)
?[facility] ?Provider Note ? ? ?CSN: [id number] ?Arrival date & time: 08/11/21  2216 ? ?  ? ?History ? ?Chief Complaint  ?Patient presents with  ? Rectal Bleeding  ? ? ?Brittany Hanson is a 88 y.o. female. ? ?Patient presents to the emergency department for evaluation of rectal bleeding.  Patient's brought to the ER by her daughter who is her caregiver.  Daughter reports that the patient has a history of dementia, has been living with her for the last 2 years.  Patient does have a history of chronic constipation, has been on MiraLAX for some time.  She started having "accidents" so the daughter decreased her MiraLAX.  Patient has been sitting on the toilet multiple times over the course of the day, not passing stool but passing blood.  Daughter only became aware of this tonight.  Patient denies rectal pain, abdominal pain.  Patient is on Eliquis because of a history of chronic atrial fibrillation. ? ? ?  ? ?Home Medications ?Prior to Admission medications   ?Medication Sig Start Date End Date Taking? Authorizing Provider  ?Acetaminophen (TYLENOL ARTHRITIS PAIN PO) Take by mouth.    [provider]  ?donepezil (ARICEPT) 10 MG tablet Take 1 tablet (10 mg total) by mouth at bedtime. 03/13/21   Lindell Spar, MD  ?Arne Cleveland 5 MG TABS tablet Take 1 tablet (5 mg total) by mouth 2 (two) times daily. 08/02/21   Lindell Spar, MD  ?hydrochlorothiazide (HYDRODIURIL) 25 MG tablet TAKE 1 TABLET (25 MG TOTAL) BY MOUTH DAILY. 06/19/21   Lindell Spar, MD  ?latanoprost (XALATAN) 0.005 % ophthalmic solution 1 drop at bedtime. 01/20/21   [provider]  ?losartan (COZAAR) 100 MG tablet TAKE 1 TABLET BY MOUTH EVERY DAY 05/29/21   Lindell Spar, MD  ?memantine (NAMENDA) 5 MG tablet Take 5 mg by mouth 2 (two) times daily. 02/13/21   [provider]  ?metoprolol succinate (TOPROL-XL) 50 MG 24 hr tablet TAKE 1 TABLET BY MOUTH EVERY DAY 05/27/21   Lindell Spar, MD  ?polyethylene glycol  (MIRALAX / GLYCOLAX) 17 g packet Take 17 g by mouth daily.    [provider]  ?simvastatin (ZOCOR) 20 MG tablet TAKE 1 TABLET BY MOUTH EVERYDAY AT BEDTIME 06/11/21   Lindell Spar, MD  ?   ? ?Allergies    ?Patient has no known allergies.   ? ?Review of Systems   ?Review of Systems  ?Gastrointestinal:  Positive for anal bleeding and constipation.  ? ?Physical Exam ?Updated Vital Signs ?BP (!) 110/56   Pulse 91   Temp 98.2 ?F (36.8 ?C) (Oral)   Resp (!) 22   Ht 5\' 5"  (1.651 m)   Wt 80.3 kg   SpO2 96%   BMI 29.45 kg/m?  ?Physical Exam ?Vitals and nursing note reviewed. Exam conducted with a chaperone present.  ?Constitutional:   ?   General: She is not in acute distress. ?   Appearance: She is well-developed.  ?HENT:  ?   Head: Normocephalic and atraumatic.  ?   Mouth/Throat:  ?   Mouth: Mucous membranes are moist.  ?Eyes:  ?   General: Vision grossly intact. Gaze aligned appropriately.  ?   Extraocular Movements: Extraocular movements intact.  ?   Conjunctiva/sclera: Conjunctivae normal.  ?Cardiovascular:  ?   Rate and Rhythm: Normal rate. Rhythm irregularly irregular.  ?   Pulses: Normal pulses.  ?   Heart sounds: Normal heart sounds, S1 normal  and S2 normal. No murmur heard. ?  No friction rub. No gallop.  ?Pulmonary:  ?   Effort: Pulmonary effort is normal. No respiratory distress.  ?   Breath sounds: Normal breath sounds.  ?Abdominal:  ?   General: Bowel sounds are normal.  ?   Palpations: Abdomen is soft.  ?   Tenderness: There is no abdominal tenderness. There is no guarding or rebound.  ?   Hernia: No hernia is present.  ?Genitourinary: ?   Rectum: External hemorrhoid present.  ?   Comments: Large amount of soft, claylike stool in the rectum.  Manual disimpaction of this revealed a fair amount of dark maroon blood mixed with the stool ?Musculoskeletal:     ?   General: No swelling.  ?   Cervical back: Full passive range of motion without pain, normal range of motion and neck supple. No spinous  process tenderness or muscular tenderness. Normal range of motion.  ?   Right lower leg: No edema.  ?   Left lower leg: No edema.  ?Skin: ?   General: Skin is warm and dry.  ?   Capillary Refill: Capillary refill takes less than 2 seconds.  ?   Findings: No ecchymosis, erythema, rash or wound.  ?Neurological:  ?   General: No focal deficit present.  ?   Mental Status: She is alert. Mental status is at baseline. She is confused.  ?   GCS: GCS eye subscore is 4. GCS verbal subscore is 5. GCS motor subscore is 6.  ?   Cranial Nerves: Cranial nerves 2-12 are intact.  ?   Sensory: Sensation is intact.  ?   Motor: Motor function is intact.  ?   Coordination: Coordination is intact.  ?Psychiatric:     ?   Attention and Perception: Attention normal.     ?   Mood and Affect: Mood normal.     ?   Speech: Speech normal.     ?   Behavior: Behavior normal.  ? ? ?ED Results / Procedures / Treatments   ?Labs ?(all labs ordered are listed, but only abnormal results are displayed) ?Labs Reviewed  ?CBC - Abnormal; Notable for the following components:  ?    Result Value  ? WBC 11.2 (*)   ? RBC 3.73 (*)   ? Hemoglobin 9.4 (*)   ? HCT 31.7 (*)   ? MCH 25.2 (*)   ? MCHC 29.7 (*)   ? RDW 16.2 (*)   ? All other components within normal limits  ?BASIC METABOLIC PANEL - Abnormal; Notable for the following components:  ? Glucose, Bld 142 (*)   ? BUN 45 (*)   ? Creatinine, Ser 1.85 (*)   ? GFR, Estimated 26 (*)   ? All other components within normal limits  ?PROTIME-INR - Abnormal; Notable for the following components:  ? Prothrombin Time 19.8 (*)   ? INR 1.7 (*)   ? All other components within normal limits  ?HEPATIC FUNCTION PANEL  ?CBC  ?TYPE AND SCREEN  ? ? ?EKG ?None ? ?Radiology ?CT ABDOMEN PELVIS WO CONTRAST ? ?Result Date: 08/12/2021 ?CLINICAL DATA:  88 year old female with rectal bleeding. Dark stools. EXAM: CT ABDOMEN AND PELVIS WITHOUT CONTRAST TECHNIQUE: Multidetector CT imaging of the abdomen and pelvis was performed following the  standard protocol without IV contrast. RADIATION DOSE REDUCTION: This exam was performed according to the departmental dose-optimization program which includes automated exposure control, adjustment of the mA and/or kV according  to patient size and/or use of iterative reconstruction technique. COMPARISON:  None. FINDINGS: Lower chest: Bronchiectasis in the lower lobes. Peribronchial linear lower lobe scarring or atelectasis. No lung base pneumonia or pleural effusion. Mild cardiomegaly. Trace physiologic appearing pericardial fluid. Hepatobiliary: Negative noncontrast liver and gallbladder. Pancreas: Fatty atrophy. Spleen: Negative. Adrenals/Urinary Tract: Normal adrenal glands. Nonobstructed kidneys. No nephrolithiasis. Occasional small benign appearing cysts with simple fluid density (no imaging follow-up recommended). No hydroureter. Unremarkable bladder. Pelvic phleboliths. Stomach/Bowel: Large stool ball in the rectum, 8 cm diameter (sagittal image 80). Circumferential mild rectal wall thickening there. Stool tapers in the distal sigmoid colon. Redundant sigmoid in the pelvis is otherwise decompressed. Mild retained stool in the descending colon. Gas and retained stool in the transverse colon. Oral contrast has just reached the hepatic flexure. Appendix remains normal on coronal image 47. No large bowel diverticulosis is identified. Negative terminal ileum. No dilated small bowel. Decompressed stomach and duodenum. No free air or free fluid. Vascular/Lymphatic: Aortoiliac calcified atherosclerosis. Mildly tortuous abdominal aorta and iliac arteries. Vascular patency is not evaluated in the absence of IV contrast. No lymphadenopathy identified. Reproductive: Negative noncontrast appearance. Other: No pelvic free fluid. Oval left anterior pelvic floor phlebolith or postinflammatory calcified lymph node. Musculoskeletal: Advanced lumbar spine degeneration with levoconvex scoliosis and multilevel spondylolisthesis.  Widespread lumbar vacuum disc, endplate spurring and sclerosis. Very severe bilateral hip osteoarthritis. No acute osseous abnormality identified. IMPRESSION: 1. Large stool ball in the rectum with mild associ

## 2021-08-12 NOTE — H&P (Signed)
?History and Physical  ? ? ?Patient: Brittany Hanson [id number] DOB: [birth year] ?DOA: 08/11/2021 ?DOS: the patient was seen and examined on 08/12/2021 ?PCP: Lindell Spar, MD  ?Patient coming from: Home lives with daughter ? ?Chief Complaint:  ?Chief Complaint  ?Patient presents with  ? Rectal Bleeding  ? ?HPI: Brittany Hanson is a 88 y.o. female with medical history significant of with history of atrial fibrillation, dementia, hypertension, presents the ED with a chief complaint of blood per rectum.  Patient lives with daughter and with patient's dementia is hard to keep track of how often she is having bowel movements.  Daughter reports that she cut patient's MiraLAX way back because she was having bowel incontinence with her depends.  The problem is, patient can't remember if she has had a bowel movement or not each day, so daughter reports she does not know when to give MiraLAX.  She has been getting it about once a week.  Daughter noticed that patient had been in the bathroom for 4 or 5 hours.  She went into check on her and patient said she felt like she needed to evacuate her bowels, but could not.  She then told daughter that she had been up the whole night with the same problem.  Daughter checked the toilet and saw blood.  Also there was blood on the toilet paper that patient was putting into the trash can.  Daughter decided to bring her in.  Patient cannot remember having any abdominal pain, nausea, vomiting.  Daughter says the patient has not complained of abdominal pain or nausea.  She has not seen patient vomit.  They both agree that patient has had normal appetite.  Patient does have a history of hemorrhoids, but has not complained of hemorrhoids hurting.  Possibly internal hemorrhoids?  Patient has no other complaints at this time. ?Patient does not smoke, does not drink alcohol, is not vaccinated for COVID, is full code. ? ?Review of Systems: As mentioned in the history of present illness. All  other systems reviewed and are negative. ?Past Medical History:  ?Diagnosis Date  ? Atrial fibrillation ([place])   ? Dementia ([place])   ? Hypertension   ? Osteoarthritis   ? ?History reviewed. No pertinent surgical history. ?Social History:  reports that she has never smoked. She has never used smokeless tobacco. She reports that she does not drink alcohol and does not use drugs. ? ?No Known Allergies ? ?Family History  ?Problem Relation Age of Onset  ? Diabetes Sister   ? Cancer Daughter   ? ? ?Prior to Admission medications   ?Medication Sig Start Date End Date Taking? Authorizing Provider  ?Acetaminophen (TYLENOL ARTHRITIS PAIN PO) Take by mouth.    [provider]  ?donepezil (ARICEPT) 10 MG tablet Take 1 tablet (10 mg total) by mouth at bedtime. 03/13/21   Lindell Spar, MD  ?Arne Cleveland 5 MG TABS tablet Take 1 tablet (5 mg total) by mouth 2 (two) times daily. 08/02/21   Lindell Spar, MD  ?hydrochlorothiazide (HYDRODIURIL) 25 MG tablet TAKE 1 TABLET (25 MG TOTAL) BY MOUTH DAILY. 06/19/21   Lindell Spar, MD  ?latanoprost (XALATAN) 0.005 % ophthalmic solution 1 drop at bedtime. 01/20/21   [provider]  ?losartan (COZAAR) 100 MG tablet TAKE 1 TABLET BY MOUTH EVERY DAY 05/29/21   Lindell Spar, MD  ?memantine (NAMENDA) 5 MG tablet Take 5 mg by mouth 2 (two) times daily. 02/13/21   [provider]  ?  metoprolol succinate (TOPROL-XL) 50 MG 24 hr tablet TAKE 1 TABLET BY MOUTH EVERY DAY 05/27/21   Lindell Spar, MD  ?polyethylene glycol (MIRALAX / GLYCOLAX) 17 g packet Take 17 g by mouth daily.    [provider]  ?simvastatin (ZOCOR) 20 MG tablet TAKE 1 TABLET BY MOUTH EVERYDAY AT BEDTIME 06/11/21   Lindell Spar, MD  ? ? ?Physical Exam: ?Vitals:  ? 08/11/21 2300 08/12/21 0000 08/12/21 0300 08/12/21 0330  ?BP: (!) 98/59 (!) 118/59 (!) 109/57 (!) 110/56  ?Pulse: 94 87 92 91  ?Resp: 20 20 (!) 21 (!) 22  ?Temp:      ?TempSrc:      ?SpO2: 99% 97% 96% 96%  ?Weight:      ?Height:      ? ?1.   General: ?Patient lying supine in bed,  no acute distress ?  ?2. Psychiatric: ?Alert and oriented, mood and behavior normal for situation, pleasant and cooperative with exam ?  ?3. Neurologic: ?Speech and language are normal, face is symmetric, moves all 4 extremities voluntarily, at baseline without acute deficits on limited exam ?  ?4. HEENMT:  ?Head is atraumatic, normocephalic, pupils reactive to light, neck is supple, trachea is midline, mucous membranes are moist ?  ?5. Respiratory : ?Lungs are clear to auscultation bilaterally without wheezing, rhonchi, rales, no cyanosis, no increase in work of breathing or accessory muscle use ?  ?6. Cardiovascular : ?Heart rate normal, rhythm is irregularly irregular, no murmurs, rubs or gallops, no peripheral edema, peripheral pulses palpated ?  ?7. Gastrointestinal:  ?Abdomen is soft, nondistended, nontender to palpation bowel sounds active, no masses or organomegaly palpated ?  ?8. Skin:  ?Skin is warm, dry and intact without rashes, acute lesions, or ulcers on limited exam ?  ?9.Musculoskeletal:  ?No acute deformities or trauma, no asymmetry in tone, no peripheral edema, peripheral pulses palpated, no tenderness to palpation in the extremities ? ?Data Reviewed: ?The ED ?Temp 98.2, heart rate 94-98, respiratory rate 16-20, blood pressure 98/59, satting at 99% ?Slight leukocytosis at 11.2, hemoglobin 9.4 ?Chemistry panel reveals an elevated BUN and creatinine 45, 1.85 ?CT abdomen pelvis shows large stool ball in rectum.  Rectal wall thickening.  Consider impaction or stercoral colitis. ?At this time more likely impaction.  There is a slight leukocytosis but more likely acute phase reactant.  No antibiotics started at this time. ?Admission requested for further work-up of GI bleed. ? ?Assessment and Plan: ?* GI bleed ?-At least 24 hours of blood per rectum ?-Hemoglobin 9.4, no comparison available ?-Typed and screened in the ED ?-Repeat CBC for trend ?-Start Protonix  twice daily ?-Consult GI ?-Clear liquid diet ?-Continue to monitor ? ?Elevated serum creatinine ?Creatinine is elevated at 1.85 ?-No comparison as patient usually gets care in [place] ?-With an elevated BUN as well, assuming dehydration and elevation from baseline of creatinine ?-Continue gentle IV hydration ?-Avoid nephrotoxic agents when possible ?-Trend in the a.m. ? ?Chronic constipation ?- Patient has not been taking MiraLAX as prescribed ?-Large stool ball in rectum, described as soft clay when ED provider was trying to disimpact ?-Patient had more and more bleeding the more she was disimpacted -possibly hemorrhoid? -No enema at this time ? ?Dementia ([place]) ?- Continue Namenda ?-Continue Aricept ? ?Essential hypertension ?Holding antihypertensives secondary to soft blood pressures ? ?Atrial fibrillation ([place]) ?Hold Eliquis given rectal bleeding ?-Continue metoprolol ? ? ? ? ? Advance Care Planning:   Code Status: Full Code  ? ?Consults: Gertie Fey ? ?  Family Communication: Daughter and son-in-law at bedside ? ?Severity of Illness: ?The appropriate patient status for this patient is OBSERVATION. Observation status is judged to be reasonable and necessary in order to provide the required intensity of service to ensure the patient's safety. The patient's presenting symptoms, physical exam findings, and initial radiographic and laboratory data in the context of their medical condition is felt to place them at decreased risk for further clinical deterioration. Furthermore, it is anticipated that the patient will be medically stable for discharge from the hospital within 2 midnights of admission.  ? ?Author: ?Rolla Plate, DO ?08/12/2021 6:15 AM ? ?For on call review [email or web address].  ?

## 2021-08-12 NOTE — Assessment & Plan Note (Signed)
Holding antihypertensives secondary to soft blood pressures ?

## 2021-08-12 NOTE — Assessment & Plan Note (Signed)
-   Patient has not been taking MiraLAX as prescribed ?-Large stool ball in rectum, described as soft clay when ED provider was trying to disimpact ?-Patient had more and more bleeding the more she was disimpacted -possibly hemorrhoid? -No enema at this time ?

## 2021-08-12 NOTE — Assessment & Plan Note (Signed)
-   Continue Namenda ?-Continue Aricept ?

## 2021-08-12 NOTE — Consult Note (Signed)
? ?Gastroenterology Consult  ? ?Referring Provider: Dr. Carlynn Purl ?Primary Care Physician:  Lindell Spar, MD ?Primary Gastroenterologist:  Dr. Abbey Chatters, previously unassigned ? ?Patient ID: Brittany Hanson; [id number]; 11/14/1931  ? ?Admit date: 08/11/2021 ? LOS: 0 days  ? ?Date of Consultation: 08/12/2021 ? ?Reason for Consultation:  GI bleed  ? ?History of Present Illness  ? ?Brittany Hanson is an 88 y.o. year old female with history of dementia, afib on Eliquis, chronic constipation, presenting to the ED due to rectal bleeding. Admitting Hgb 9.4, 8.2 this morning. CT abd/pelvis without contrast noted large stool ball in rectum with mild associated rectal wall thickening, query stercoral colitis. Attempt at disimpaction in ED only partially successful as stool was soft and "claylike". Moderate amount of dark maroon blood mixed with stool noted in ED.  ? ?Patient by herself this morning. No family at bedside. Lives with daughter. She is not oriented to year. Limited historian. She is able to report why she is at the hospital. No overt bleeding this morning. Denies abdominal pain. No dysphagia. Unable to provide significant history. No prior colonoscopy. She is also adamant that she does not want a colonoscopy.  ? ?Attempted to call daughter but unable to reach.  ? ? ? ? ?Past Medical History:  ?Diagnosis Date  ? Atrial fibrillation ([place])   ? Dementia ([place])   ? Hypertension   ? Osteoarthritis   ? ? ?History reviewed. No pertinent surgical history. ? ?Prior to Admission medications   ?Medication Sig Start Date End Date Taking? Authorizing Provider  ?Acetaminophen (TYLENOL ARTHRITIS PAIN PO) Take by mouth.    [provider]  ?donepezil (ARICEPT) 10 MG tablet Take 1 tablet (10 mg total) by mouth at bedtime. 03/13/21   Lindell Spar, MD  ?Arne [place] 5 MG TABS tablet Take 1 tablet (5 mg total) by mouth 2 (two) times daily. 08/02/21   Lindell Spar, MD  ?hydrochlorothiazide (HYDRODIURIL) 25 MG tablet TAKE 1  TABLET (25 MG TOTAL) BY MOUTH DAILY. 06/19/21   Lindell Spar, MD  ?latanoprost (XALATAN) 0.005 % ophthalmic solution 1 drop at bedtime. 01/20/21   [provider]  ?losartan (COZAAR) 100 MG tablet TAKE 1 TABLET BY MOUTH EVERY DAY 05/29/21   Lindell Spar, MD  ?memantine (NAMENDA) 5 MG tablet Take 5 mg by mouth 2 (two) times daily. 02/13/21   [provider]  ?metoprolol succinate (TOPROL-XL) 50 MG 24 hr tablet TAKE 1 TABLET BY MOUTH EVERY DAY 05/27/21   Lindell Spar, MD  ?polyethylene glycol (MIRALAX / GLYCOLAX) 17 g packet Take 17 g by mouth daily.    [provider]  ?simvastatin (ZOCOR) 20 MG tablet TAKE 1 TABLET BY MOUTH EVERYDAY AT BEDTIME 06/11/21   Lindell Spar, MD  ? ? ?Current Facility-Administered Medications  ?Medication Dose Route Frequency Provider Last Rate Last Admin  ? 0.9 %  sodium chloride infusion   Intravenous Continuous Zierle-Ghosh, Asia B, DO 75 mL/hr at 08/12/21 0625 New Bag at 08/12/21 [id number]  ? acetaminophen (TYLENOL) tablet 650 mg  650 mg Oral Q6H PRN Zierle-Ghosh, Asia B, DO      ? Or  ? acetaminophen (TYLENOL) suppository 650 mg  650 mg Rectal Q6H PRN Zierle-Ghosh, Asia B, DO      ? donepezil (ARICEPT) tablet 10 mg  10 mg Oral QHS Zierle-Ghosh, Asia B, DO      ? memantine (NAMENDA) tablet 5 mg  5 mg Oral BID Zierle-Ghosh, Asia B, DO  5 mg at 08/12/21 [id number]  ? morphine (PF) 2 MG/ML injection 2 mg  2 mg Intravenous Q2H PRN Zierle-Ghosh, Asia B, DO      ? ondansetron (ZOFRAN) tablet 4 mg  4 mg Oral Q6H PRN Zierle-Ghosh, Asia B, DO      ? Or  ? ondansetron (ZOFRAN) injection 4 mg  4 mg Intravenous Q6H PRN Zierle-Ghosh, Asia B, DO      ? oxyCODONE (Oxy IR/ROXICODONE) immediate release tablet 5 mg  5 mg Oral Q4H PRN Zierle-Ghosh, Asia B, DO      ? pantoprazole (PROTONIX) injection 40 mg  40 mg Intravenous Q12H Zierle-Ghosh, Asia B, DO   40 mg at 08/12/21 [id number]  ? polyethylene glycol (MIRALAX / GLYCOLAX) packet 17 g  17 g Oral Daily Zierle-Ghosh, Asia B, DO      ?  simvastatin (ZOCOR) tablet 20 mg  20 mg Oral q1800 Zierle-Ghosh, Asia B, DO      ? ? ?Allergies as of 08/11/2021  ? (No Known Allergies)  ? ? ?Family History  ?Problem Relation Age of Onset  ? Diabetes Sister   ? Cancer Daughter   ? ? ?Social History  ? ?Socioeconomic History  ? Marital status: Widowed  ?  Spouse name: Not on file  ? Number of children: 3  ? Years of education: 6th  ? Highest education level: Not on file  ?Occupational History  ? Not on file  ?Tobacco Use  ? Smoking status: Never  ? Smokeless tobacco: Never  ?Vaping Use  ? Vaping Use: Never used  ?Substance and Sexual Activity  ? Alcohol use: Never  ? Drug use: Never  ? Sexual activity: Not Currently  ?Other Topics Concern  ? Not on file  ?Social History Narrative  ? Not on file  ? ?Social Determinants of Health  ? ?Financial Resource Strain: Not on file  ?Food Insecurity: Not on file  ?Transportation Needs: Not on file  ?Physical Activity: Not on file  ?Stress: Not on file  ?Social Connections: Not on file  ?Intimate Partner Violence: Not on file  ? ? ? ?Review of Systems  ? ?Gen: Denies any fever, chills, loss of appetite, change in weight or weight loss ?CV: Denies chest pain, heart palpitations, syncope, edema  ?Resp: Denies shortness of breath with rest, cough, wheezing, coughing up blood, and pleurisy. ?GI: see HPI ?GU : Denies urinary burning, blood in urine, urinary frequency, and urinary incontinence. ?MS: Denies joint pain, limitation of movement, swelling, cramps, and atrophy.  ?Derm: Denies rash, itching, dry skin, hives. ?Psych: Denies depression, anxiety, memory loss, hallucinations, and confusion. ?Heme: see HPI ?Neuro:  Denies any headaches, dizziness, paresthesias, shaking ? ?Physical Exam  ? ?Vital Signs in last 24 hours: ?Temp:  [98.2 ?F (36.8 ?C)-98.9 ?F (37.2 ?C)] 98.3 ?F (36.8 ?C) (04/03 [id number]) ?Pulse Rate:  [84-98] 84 (04/03 0643) ?Resp:  [16-22] 20 (04/03 [id number]) ?BP: (98-125)/(48-63) 125/63 (04/03 [id number]) ?SpO2:  [96 %-99 %] 98 %  (04/03 0643) ?Weight:  [73.6 kg-80.3 kg] 73.6 kg (04/03 0643) ?Last BM Date : 08/12/21 ? ?General:   Alert,  sallow-appearing, no distress.  ?Head:  Normocephalic and atraumatic. ?Eyes:  Sclera clear, no icterus.    ?Ears:  Normal auditory acuity. ?Mouth:  No deformity or lesions, dentition normal. ?Lungs:  Clear throughout to auscultation.    ?Heart:  S1 S2 present irregularly irregular ?Abdomen:  Soft, mild TTP lower abdomen and nondistended. No masses, hepatosplenomegaly or hernias noted. Normal bowel sounds, without guarding,  and without rebound.   ?Rectal: external hemorrhoids and hemorrhoid tags. Internal exam with soft stool more distal rectum not reachable. Discomfort with exam. No obvious mass. No gross blood on exam ?Msk:  Symmetrical without gross deformities. Normal posture. ?Extremities:  Without edema. ?Neurologic:  Alert and  oriented to person and situation ?Skin:  Intact without significant lesions or rashes. ?Psych:  Alert and cooperative. Normal mood and affect. ? ?Intake/Output from previous day: ?No intake/output data recorded. ?Intake/Output this shift: ?No intake/output data recorded. ? ? ? ?Labs/Studies  ? ?Recent Labs ?Recent Labs  ?  08/11/21 ?2344 08/12/21 ?0603  ?WBC 11.2* 8.6  ?HGB 9.4* 8.2*  ?HCT 31.7* 28.0*  ?PLT 261 208  ? ?BMET ?Recent Labs  ?  08/11/21 ?2344  ?NA 138  ?K 4.3  ?CL 103  ?CO2 24  ?GLUCOSE 142*  ?BUN 45*  ?CREATININE 1.85*  ?CALCIUM 9.7  ? ?LFT ?Recent Labs  ?  08/11/21 ?2344  ?PROT 6.9  ?ALBUMIN 4.2  ?AST 22  ?ALT 14  ?ALKPHOS 72  ?BILITOT 0.5  ?BILIDIR 0.1  ?IBILI 0.4  ? ?PT/INR ?Recent Labs  ?  08/11/21 ?2344  ?LABPROT 19.8*  ?INR 1.7*  ? ? ? ?Radiology/Studies ?CT ABDOMEN PELVIS WO CONTRAST ? ?Result Date: 08/12/2021 ?CLINICAL DATA:  88 year old female with rectal bleeding. Dark stools. EXAM: CT ABDOMEN AND PELVIS WITHOUT CONTRAST TECHNIQUE: Multidetector CT imaging of the abdomen and pelvis was performed following the standard protocol without IV contrast. RADIATION  DOSE REDUCTION: This exam was performed according to the departmental dose-optimization program which includes automated exposure control, adjustment of the mA and/or kV according to patient size and/

## 2021-08-12 NOTE — Assessment & Plan Note (Signed)
-  At least 24 hours of blood per rectum ?-Hemoglobin 9.4, no comparison available ?-Typed and screened in the ED ?-Repeat CBC for trend ?-Start Protonix twice daily ?-Consult GI ?-Clear liquid diet ?-Continue to monitor ?

## 2021-08-13 DIAGNOSIS — K5909 Other constipation: Secondary | ICD-10-CM | POA: Diagnosis not present

## 2021-08-13 DIAGNOSIS — F0390 Unspecified dementia without behavioral disturbance: Secondary | ICD-10-CM | POA: Diagnosis present

## 2021-08-13 DIAGNOSIS — I482 Chronic atrial fibrillation, unspecified: Secondary | ICD-10-CM | POA: Diagnosis not present

## 2021-08-13 DIAGNOSIS — E860 Dehydration: Secondary | ICD-10-CM | POA: Diagnosis not present

## 2021-08-13 DIAGNOSIS — I10 Essential (primary) hypertension: Secondary | ICD-10-CM | POA: Diagnosis not present

## 2021-08-13 DIAGNOSIS — K2289 Other specified disease of esophagus: Secondary | ICD-10-CM | POA: Diagnosis not present

## 2021-08-13 DIAGNOSIS — K648 Other hemorrhoids: Secondary | ICD-10-CM | POA: Diagnosis not present

## 2021-08-13 DIAGNOSIS — M1990 Unspecified osteoarthritis, unspecified site: Secondary | ICD-10-CM | POA: Diagnosis present

## 2021-08-13 DIAGNOSIS — K922 Gastrointestinal hemorrhage, unspecified: Secondary | ICD-10-CM | POA: Diagnosis not present

## 2021-08-13 DIAGNOSIS — Z91199 Patient's noncompliance with other medical treatment and regimen due to unspecified reason: Secondary | ICD-10-CM | POA: Diagnosis not present

## 2021-08-13 DIAGNOSIS — Z79899 Other long term (current) drug therapy: Secondary | ICD-10-CM | POA: Diagnosis not present

## 2021-08-13 DIAGNOSIS — D6832 Hemorrhagic disorder due to extrinsic circulating anticoagulants: Secondary | ICD-10-CM | POA: Diagnosis not present

## 2021-08-13 DIAGNOSIS — D519 Vitamin B12 deficiency anemia, unspecified: Secondary | ICD-10-CM | POA: Diagnosis not present

## 2021-08-13 DIAGNOSIS — G309 Alzheimer's disease, unspecified: Secondary | ICD-10-CM | POA: Diagnosis not present

## 2021-08-13 DIAGNOSIS — Z833 Family history of diabetes mellitus: Secondary | ICD-10-CM | POA: Diagnosis not present

## 2021-08-13 DIAGNOSIS — R7989 Other specified abnormal findings of blood chemistry: Secondary | ICD-10-CM | POA: Diagnosis not present

## 2021-08-13 DIAGNOSIS — D509 Iron deficiency anemia, unspecified: Secondary | ICD-10-CM

## 2021-08-13 DIAGNOSIS — T45515A Adverse effect of anticoagulants, initial encounter: Secondary | ICD-10-CM | POA: Diagnosis present

## 2021-08-13 DIAGNOSIS — K644 Residual hemorrhoidal skin tags: Secondary | ICD-10-CM | POA: Diagnosis present

## 2021-08-13 DIAGNOSIS — R69 Illness, unspecified: Secondary | ICD-10-CM | POA: Diagnosis not present

## 2021-08-13 DIAGNOSIS — Z7901 Long term (current) use of anticoagulants: Secondary | ICD-10-CM | POA: Diagnosis not present

## 2021-08-13 DIAGNOSIS — K5289 Other specified noninfective gastroenteritis and colitis: Secondary | ICD-10-CM | POA: Diagnosis present

## 2021-08-13 DIAGNOSIS — K625 Hemorrhage of anus and rectum: Secondary | ICD-10-CM | POA: Diagnosis not present

## 2021-08-13 DIAGNOSIS — I4821 Permanent atrial fibrillation: Secondary | ICD-10-CM | POA: Diagnosis not present

## 2021-08-13 DIAGNOSIS — N179 Acute kidney failure, unspecified: Secondary | ICD-10-CM | POA: Diagnosis not present

## 2021-08-13 DIAGNOSIS — D62 Acute posthemorrhagic anemia: Secondary | ICD-10-CM | POA: Diagnosis not present

## 2021-08-13 DIAGNOSIS — K449 Diaphragmatic hernia without obstruction or gangrene: Secondary | ICD-10-CM | POA: Diagnosis not present

## 2021-08-13 LAB — COMPREHENSIVE METABOLIC PANEL
ALT: 10 U/L (ref 0–44)
AST: 17 U/L (ref 15–41)
Albumin: 3.1 g/dL — ABNORMAL LOW (ref 3.5–5.0)
Alkaline Phosphatase: 51 U/L (ref 38–126)
Anion gap: 7 (ref 5–15)
BUN: 29 mg/dL — ABNORMAL HIGH (ref 8–23)
CO2: 23 mmol/L (ref 22–32)
Calcium: 9 mg/dL (ref 8.9–10.3)
Chloride: 113 mmol/L — ABNORMAL HIGH (ref 98–111)
Creatinine, Ser: 1.1 mg/dL — ABNORMAL HIGH (ref 0.44–1.00)
GFR, Estimated: 49 mL/min — ABNORMAL LOW (ref >60)
Glucose, Bld: 92 mg/dL (ref 70–99)
Potassium: 3.8 mmol/L (ref 3.5–5.1)
Sodium: 143 mmol/L (ref 135–145)
Total Bilirubin: 0.7 mg/dL (ref 0.3–1.2)
Total Protein: 5.1 g/dL — ABNORMAL LOW (ref 6.5–8.1)

## 2021-08-13 LAB — CBC
HCT: 25.7 % — ABNORMAL LOW (ref 36.0–46.0)
Hemoglobin: 7.5 g/dL — ABNORMAL LOW (ref 12.0–15.0)
MCH: 25.4 pg — ABNORMAL LOW (ref 26.0–34.0)
MCHC: 29.2 g/dL — ABNORMAL LOW (ref 30.0–36.0)
MCV: 87.1 fL (ref 80.0–100.0)
Platelets: 188 10*3/uL (ref 150–400)
RBC: 2.95 MIL/uL — ABNORMAL LOW (ref 3.87–5.11)
RDW: 16.8 % — ABNORMAL HIGH (ref 11.5–15.5)
WBC: 4.8 10*3/uL (ref 4.0–10.5)
nRBC: 0 % (ref 0.0–0.2)

## 2021-08-13 LAB — MRSA NEXT GEN BY PCR, NASAL: MRSA by PCR Next Gen: NOT DETECTED

## 2021-08-13 MED ORDER — PEG 3350-KCL-NA BICARB-NACL 420 G PO SOLR
4000.0000 mL | Freq: Once | ORAL | Status: AC
  Administered 2021-08-13: 4000 mL via ORAL

## 2021-08-13 NOTE — Progress Notes (Signed)
?PROGRESS NOTE ? ? ? ?VICCI YUNDT  [id number] DOB: [birth year] DOA: 08/11/2021 ?PCP: Lindell Spar, MD ? ? ?Brief Narrative:  ?KIYANNE OM is a 88 y.o. female with medical history significant of with history of atrial fibrillation, dementia, hypertension who presented to ED with a chief complaint of blood per rectum.  GI has been consulted.  Her hemoglobin dropped from 9.4 to 8.2 and 7.5 today.  No indication of transfusion she is hemodynamically stable as well.  GI offered colonoscopy but patient declined.  They have talked to the patient today.  She is scheduled to have colonoscopy tomorrow.  Eliquis on hold. ? ?Assessment & Plan: ?  ?Principal Problem: ?  GI bleed ?Active Problems: ?  Atrial fibrillation ([place]) ?  Essential hypertension ?  Dementia ([place]) ?  Chronic constipation ?  Elevated serum creatinine ?  Iron deficiency anemia ? ?Bright red blood per rectum/lower GI bleed/acute blood loss anemia: CT abdomen shows constipation with possible stercoral colitis.  Also has history of internal hemorrhoids.  Patient has dementia and forgets things easily.  Unsure whether she has had any further bleeding or not.  No report from nursing as well however her hemoglobin has dropped to 7.5, no indication of transfusion.  GI has seen her and they plan to do EGD and colonoscopy tomorrow morning.  Continue to hold Eliquis.  Continue PPI. ?  ?AKI vs CKD: We do not have any records of the patient in the past to compare with.  She presented with creatinine of 1.85 which has improved to 1.10.  Time will tell whether this is AKI or CKD.  Continue IV fluids. ?  ?Chronic constipation ?- Patient has not been taking MiraLAX as prescribed ?-Large stool ball in rectum, described as soft clay when ED provider was trying to disimpact.  ?  ?Dementia ([place]) ?- Continue Namenda ?-Continue Aricept ?  ?Essential hypertension ?Holding antihypertensives secondary to soft blood pressures ?  ?Atrial fibrillation ([place]) ?Hold Eliquis  given rectal bleeding ?-Continue metoprolol ?  ? ?DVT prophylaxis: SCDs Start: 08/12/21 0557 ?  Code Status: Full Code  ?Family Communication: Granddaughter none present at bedside.  Plan of care discussed with granddaughter in length and she verbalized understanding and agreed with it. ? ?Status is: Observation ?The patient will require care spanning > 2 midnights and should be moved to inpatient because: GI plans to do EGD and colonoscopy tomorrow. ? ? ?Estimated body mass index is 27 kg/m? as calculated from the following: ?  Height as of this encounter: 5\' 5"  (1.651 m). ?  Weight as of this encounter: 73.6 kg. ? ?  ?Nutritional Assessment: ?Body mass index is 27 kg/m?Marland KitchenMarland Kitchen ?Seen by dietician.  I agree with the assessment and plan as outlined below: ?Nutrition Status: ?  ?  ?  ? ?. ?Skin Assessment: ?I have examined the patient's skin and I agree with the wound assessment as performed by the wound care RN as outlined below: ?  ? ?Consultants:  ?GI ? ?Procedures:  ?None yet ? ?Antimicrobials:  ?Anti-infectives (From admission, onward)  ? ? None  ? ?  ?  ? ? ?Subjective: ?Seen and examined.  Patient alert and partly oriented.  Looks comfortable.  Has no complaints.  Granddaughter at the bedside. ? ?Objective: ?Vitals:  ? 08/12/21 [id number] 08/12/21 1240 08/12/21 2155 08/13/21 0432  ?BP: 125/63 (!) 96/58 (!) 108/54 100/76  ?Pulse: 84 70 91 98  ?Resp: 20 16 16 18   ?Temp: 98.3 ?F (36.8 ?C) 97.8 ?  F (36.6 ?C) 98.1 ?F (36.7 ?C) 97.9 ?F (36.6 ?C)  ?TempSrc:   Oral Oral  ?SpO2: 98% 91% 99% 97%  ?Weight: 73.6 kg     ?Height: 5\' 5"  (1.651 m)     ? ? ?Intake/Output Summary (Last 24 hours) at 08/13/2021 1136 ?Last data filed at 08/13/2021 0900 ?Gross per 24 hour  ?Intake 2080 ml  ?Output 875 ml  ?Net 1205 ml  ? ?Filed Weights  ? 08/11/21 2229 08/12/21 0643  ?Weight: 80.3 kg 73.6 kg  ? ? ?Examination: ? ?General exam: Appears calm and comfortable  ?Respiratory system: Clear to auscultation. Respiratory effort normal. ?Cardiovascular  system: S1 & S2 heard, RRR. No JVD, murmurs, rubs, gallops or clicks. No pedal edema. ?Gastrointestinal system: Abdomen is nondistended, soft and nontender. No organomegaly or masses felt. Normal bowel sounds heard. ?Central nervous system: Alert and oriented. No focal neurological deficits. ?Extremities: Symmetric 5 x 5 power. ?Skin: No rashes, lesions or ulcers ?Psychiatry: Judgement and insight appear poor ? ? ?Data Reviewed: I have personally reviewed following labs and imaging studies ? ?CBC: ?Recent Labs  ?Lab 08/11/21 ?2344 08/12/21 ?0603 08/12/21 ?1236 08/12/21 ?1651 08/13/21 ?0543  ?WBC 11.2* 8.6 8.4 6.3 4.8  ?HGB 9.4* 8.2* 9.3* 8.4* 7.5*  ?HCT 31.7* 28.0* 31.0* 27.7* 25.7*  ?MCV 85.0 84.3 84.9 85.2 87.1  ?PLT 261 208 262 196 188  ? ?Basic Metabolic Panel: ?Recent Labs  ?Lab 08/11/21 ?2344 08/13/21 ?0543  ?NA 138 143  ?K 4.3 3.8  ?CL 103 113*  ?CO2 24 23  ?GLUCOSE 142* 92  ?BUN 45* 29*  ?CREATININE 1.85* 1.10*  ?CALCIUM 9.7 9.0  ? ?GFR: ?Estimated Creatinine Clearance: 36.2 mL/min (A) (by C-G formula based on SCr of 1.1 mg/dL (H)). ?Liver Function Tests: ?Recent Labs  ?Lab 08/11/21 ?2344 08/13/21 ?0543  ?AST 22 17  ?ALT 14 10  ?ALKPHOS 72 51  ?BILITOT 0.5 0.7  ?PROT 6.9 5.1*  ?ALBUMIN 4.2 3.1*  ? ?No results for input(s): LIPASE, AMYLASE in the last 168 hours. ?No results for input(s): AMMONIA in the last 168 hours. ?Coagulation Profile: ?Recent Labs  ?Lab 08/11/21 ?2344  ?INR 1.7*  ? ?Cardiac Enzymes: ?No results for input(s): CKTOTAL, CKMB, CKMBINDEX, TROPONINI in the last 168 hours. ?BNP (last 3 results) ?No results for input(s): PROBNP in the last 8760 hours. ?HbA1C: ?No results for input(s): HGBA1C in the last 72 hours. ?CBG: ?No results for input(s): GLUCAP in the last 168 hours. ?Lipid Profile: ?No results for input(s): CHOL, HDL, LDLCALC, TRIG, CHOLHDL, LDLDIRECT in the last 72 hours. ?Thyroid Function Tests: ?No results for input(s): TSH, T4TOTAL, FREET4, T3FREE, THYROIDAB in the last 72  hours. ?Anemia Panel: ?Recent Labs  ?  08/11/21 ?2344 08/12/21 ?0603  ?[id number] 145*  --   ?FOLATE 31.8  --   ?FERRITIN 10*  --   ?TIBC 436  --   ?IRON 24*  --   ?RETICCTPCT  --  1.6  ? ?Sepsis Labs: ?No results for input(s): PROCALCITON, LATICACIDVEN in the last 168 hours. ? ?No results found for this or any previous visit (from the past 240 hour(s)).  ? ?Radiology Studies: ?CT ABDOMEN PELVIS WO CONTRAST ? ?Result Date: 08/12/2021 ?CLINICAL DATA:  88 year old female with rectal bleeding. Dark stools. EXAM: CT ABDOMEN AND PELVIS WITHOUT CONTRAST TECHNIQUE: Multidetector CT imaging of the abdomen and pelvis was performed following the standard protocol without IV contrast. RADIATION DOSE REDUCTION: This exam was performed according to the departmental dose-optimization program which includes automated exposure control, adjustment  of the mA and/or kV according to patient size and/or use of iterative reconstruction technique. COMPARISON:  None. FINDINGS: Lower chest: Bronchiectasis in the lower lobes. Peribronchial linear lower lobe scarring or atelectasis. No lung base pneumonia or pleural effusion. Mild cardiomegaly. Trace physiologic appearing pericardial fluid. Hepatobiliary: Negative noncontrast liver and gallbladder. Pancreas: Fatty atrophy. Spleen: Negative. Adrenals/Urinary Tract: Normal adrenal glands. Nonobstructed kidneys. No nephrolithiasis. Occasional small benign appearing cysts with simple fluid density (no imaging follow-up recommended). No hydroureter. Unremarkable bladder. Pelvic phleboliths. Stomach/Bowel: Large stool ball in the rectum, 8 cm diameter (sagittal image 80). Circumferential mild rectal wall thickening there. Stool tapers in the distal sigmoid colon. Redundant sigmoid in the pelvis is otherwise decompressed. Mild retained stool in the descending colon. Gas and retained stool in the transverse colon. Oral contrast has just reached the hepatic flexure. Appendix remains normal on coronal  image 47. No large bowel diverticulosis is identified. Negative terminal ileum. No dilated small bowel. Decompressed stomach and duodenum. No free air or free fluid. Vascular/Lymphatic: Aortoiliac calcified atherosclerosi

## 2021-08-13 NOTE — TOC Transition Note (Signed)
Transition of Care (TOC) - CM/SW Discharge Note ? ? ?Patient Details  ?Name: Brittany Hanson ?MRN: [id number] ?Date of Birth: 1931/07/30 ? ?Transition of Care (TOC) CM/SW Contact:  ?Villa Herb, LCSWA ?Phone Number: ?08/13/2021, 11:15 AM ? ? ?Clinical Narrative:    ?CSW spoke with pts daughter/POA who is in room with pt. Pt declines HH at this time and pts daughter is understanding with her mothers decision.  ? ?Final next level of care: Home/Self Care ?Barriers to Discharge: No Barriers Identified ? ? ?Patient Goals and CMS Choice ?Patient states their goals for this hospitalization and ongoing recovery are:: Return home ?CMS Medicare.gov Compare Post Acute Care list provided to:: Patient Represenative (must comment) ?Choice offered to / list presented to : [facility] POA / Guardian, Adult Children ? ?Discharge Placement ?  ?           ?  ?  ?  ?  ? ?Discharge Plan and Services ?  ?  ?           ?  ?  ?  ?  ?  ?  ?  ?  ?  ?  ? ?Social Determinants of Health (SDOH) Interventions ?  ? ? ?Readmission Risk Interventions ?   ? View : No data to display.  ?  ?  ?  ? ? ? ? ? ?

## 2021-08-13 NOTE — Progress Notes (Addendum)
? ? ?Subjective: ?Patient in unaware if she had a BM yesterday or any recurrent bleeding. Denies abdominal pain, nausea, or vomiting. Doesn't know if she has ever had melena. Denies reflux symptoms or dysphagia. Denies NSAIDs. No prior EGD or colonoscopy.  ? ?Streaks of bright red blood in stool x 2 yesterday per nursing with last occurrence between 5-6 PM. No reports of rectal bleeding overnight. Stool has been formed. No melena.  ? ?Objective: ?Vital signs in last 24 hours: ?Temp:  [97.8 ?F (36.6 ?C)-98.1 ?F (36.7 ?C)] 97.9 ?F (36.6 ?C) (04/04 [id number]) ?Pulse Rate:  [70-98] 98 (04/04 0432) ?Resp:  [16-18] 18 (04/04 0432) ?BP: (96-108)/(54-76) 100/76 (04/04 0432) ?SpO2:  [91 %-99 %] 97 % (04/04 0432) ?Last BM Date : 08/12/21 ?General:   Alert and oriented, pleasant, NAD ?Head:  Normocephalic and atraumatic. ?Eyes:  No icterus, sclera clear. Conjuctiva pink.  ?Abdomen:  Bowel sounds present, soft, non-distended. Mild TTP in suprapubic region and LLQ. No rebound or guarding. No masses appreciated  ?Msk:  Symmetrical without gross deformities. Normal posture. ?Extremities:  Without edema. ?Neurologic:  Alert and  oriented to self, location, and situation.  ?Skin:  Warm and dry, intact without significant lesions.  ?Psych: Flat affect. ? ?Intake/Output from previous day: ?04/03 0701 - 04/04 0700 ?In: 2080 [P.O.:1080; I.V.:1000] ?Out: [place] [Urine:875] ?Intake/Output this shift: ?No intake/output data recorded. ? ?Lab Results: ?Recent Labs  ?  08/12/21 ?0603 08/12/21 ?1236 08/12/21 ?1651  ?WBC 8.6 8.4 6.3  ?HGB 8.2* 9.3* 8.4*  ?HCT 28.0* 31.0* 27.7*  ?PLT 208 262 196  ? ?BMET ?Recent Labs  ?  08/11/21 ?2344 08/13/21 ?0543  ?NA 138 143  ?K 4.3 3.8  ?CL 103 113*  ?CO2 24 23  ?GLUCOSE 142* 92  ?BUN 45* 29*  ?CREATININE 1.85* 1.10*  ?CALCIUM 9.7 9.0  ? ?LFT ?Recent Labs  ?  08/11/21 ?2344 08/13/21 ?0543  ?PROT 6.9 5.1*  ?ALBUMIN 4.2 3.1*  ?AST 22 17  ?ALT 14 10  ?ALKPHOS 72 51  ?BILITOT 0.5 0.7  ?BILIDIR 0.1  --   ?IBILI 0.4   --   ? ?PT/INR ?Recent Labs  ?  08/11/21 ?2344  ?LABPROT 19.8*  ?INR 1.7*  ? ? ?Studies/Results: ?CT ABDOMEN PELVIS WO CONTRAST ? ?Result Date: 08/12/2021 ?CLINICAL DATA:  88 year old female with rectal bleeding. Dark stools. EXAM: CT ABDOMEN AND PELVIS WITHOUT CONTRAST TECHNIQUE: Multidetector CT imaging of the abdomen and pelvis was performed following the standard protocol without IV contrast. RADIATION DOSE REDUCTION: This exam was performed according to the departmental dose-optimization program which includes automated exposure control, adjustment of the mA and/or kV according to patient size and/or use of iterative reconstruction technique. COMPARISON:  None. FINDINGS: Lower chest: Bronchiectasis in the lower lobes. Peribronchial linear lower lobe scarring or atelectasis. No lung base pneumonia or pleural effusion. Mild cardiomegaly. Trace physiologic appearing pericardial fluid. Hepatobiliary: Negative noncontrast liver and gallbladder. Pancreas: Fatty atrophy. Spleen: Negative. Adrenals/Urinary Tract: Normal adrenal glands. Nonobstructed kidneys. No nephrolithiasis. Occasional small benign appearing cysts with simple fluid density (no imaging follow-up recommended). No hydroureter. Unremarkable bladder. Pelvic phleboliths. Stomach/Bowel: Large stool ball in the rectum, 8 cm diameter (sagittal image 80). Circumferential mild rectal wall thickening there. Stool tapers in the distal sigmoid colon. Redundant sigmoid in the pelvis is otherwise decompressed. Mild retained stool in the descending colon. Gas and retained stool in the transverse colon. Oral contrast has just reached the hepatic flexure. Appendix remains normal on coronal image 47. No large bowel diverticulosis is  identified. Negative terminal ileum. No dilated small bowel. Decompressed stomach and duodenum. No free air or free fluid. Vascular/Lymphatic: Aortoiliac calcified atherosclerosis. Mildly tortuous abdominal aorta and iliac arteries. Vascular  patency is not evaluated in the absence of IV contrast. No lymphadenopathy identified. Reproductive: Negative noncontrast appearance. Other: No pelvic free fluid. Oval left anterior pelvic floor phlebolith or postinflammatory calcified lymph node. Musculoskeletal: Advanced lumbar spine degeneration with levoconvex scoliosis and multilevel spondylolisthesis. Widespread lumbar vacuum disc, endplate spurring and sclerosis. Very severe bilateral hip osteoarthritis. No acute osseous abnormality identified. IMPRESSION: 1. Large stool ball in the rectum with mild associated rectal wall thickening. Consider fecal impaction, Stercoral Colitis. No large bowel diverticulosis. Oral contrast has reached the hepatic flexure. Appendix is normal. 2. No other acute or inflammatory process identified in the non-contrast abdomen or pelvis. 3. Bronchiectasis in both lower lobes. Advanced lumbar spine degeneration. Very severe bilateral hip osteoarthritis.Aortic Atherosclerosis (ICD10-I70.0). Electronically Signed   By: Genevie Ann M.D.   On: 08/12/2021 04:29   ? ?Assessment: ? 88 y.o. year old female with history of dementia, afib on Eliquis, chronic constipation, presenting to the ED on 4/3 due to rectal bleeding. CT abd/pelvis without contrast noted large stool ball in rectum with mild associated rectal wall thickening, query stercoral colitis. ? ?Rectal bleeding:  ?Multifactorial in the setting of impaction, suspected stercoral colitis, and internal hemorrhoids likely contributing.  Can't rule out malignancy as she has never had a colonoscopy. Reports of dark maroon blood mixed with stool in the ED upon disimpaction attempt.  Per nursing, patient had 2 bowel movements with streaks of bright red blood yesterday.  No reports of rectal bleeding overnight or this morning. Bowels are now moving well with MiraLAX twice daily and bisacodyl suppository yesterday. Also receiving anusol suppositories BID. Hemoglobin down as low as 8.2 yesterday  from 9.4 on admission, hemodilution likely contributing. Will order repeat CBC this morning. Ultimately, would recommend colonoscopy to evaluate rectal bleeding and anemia. Possibly tomorrow as Eliquis will be held for 48 hours at that point.  ? ? ?Microcytic Anemia: ?With iron and B12 deficiency. No prior EGD or colonoscopy. Hgb 9.4 on admission, unknown baseline. Hgb down to 8.2 yesterday. Will need to repeat CBC this morning. Admitted with rectal bleeding as per above with differentials including stercoral colitis, and internal hemorrhoids, or possible malignancy. Can't rule out upper GI source of IDA such as PUD, AMVs. Denies NSAIDs, chronically anticoagulated. Ultimately, would recommend TCS + EGD for further evaluation of IDA and rectal bleeding, possibly tomorrow. Will discuss with Dr. Jenetta Downer. Patient is agreeable. Would need to discuss with daughter as well due to dementia.  ? ?Plan: ?CBC ?Continue to hold Eliquis for now. ?Continue clear liquid diet for now.   ?Continue MiraLAX twice daily. ?Consider EGD and colonoscopy for further evaluation of IDA, possibly tomorrow.  Will discuss with Dr. Jenetta Downer. ?Continue PPI twice daily. ?Needs B12 supplementation. Will defer to hospitalist.  ? ? LOS: 0 days  ? ? 08/13/2021, 7:42 AM ? ? ?Aliene Altes, PA-C ?[facility]  ? ? ?Addendum:  ?Hemoglobin down to 7.5 today.  We will need to continue to monitor and transfuse if hemoglobin drops below 7.  Spoke with Dr. Jenetta Downer. Agrees with EGD and colonoscopy tomorrow.  Spoke with patient's daughter Leia Alf who agrees with proceeding with procedures. ?

## 2021-08-14 ENCOUNTER — Encounter (HOSPITAL_COMMUNITY): Payer: Self-pay | Admitting: Family Medicine

## 2021-08-14 ENCOUNTER — Inpatient Hospital Stay (HOSPITAL_COMMUNITY): Payer: Medicare HMO | Admitting: Anesthesiology

## 2021-08-14 ENCOUNTER — Encounter (HOSPITAL_COMMUNITY)
Admission: EM | Disposition: A | Discharge status: Home Health | Payer: Self-pay | Source: Home / Self Care | Attending: Internal Medicine

## 2021-08-14 DIAGNOSIS — K2289 Other specified disease of esophagus: Secondary | ICD-10-CM

## 2021-08-14 DIAGNOSIS — G309 Alzheimer's disease, unspecified: Secondary | ICD-10-CM | POA: Diagnosis not present

## 2021-08-14 DIAGNOSIS — K625 Hemorrhage of anus and rectum: Secondary | ICD-10-CM

## 2021-08-14 DIAGNOSIS — D509 Iron deficiency anemia, unspecified: Secondary | ICD-10-CM

## 2021-08-14 DIAGNOSIS — I4821 Permanent atrial fibrillation: Secondary | ICD-10-CM | POA: Diagnosis not present

## 2021-08-14 DIAGNOSIS — K449 Diaphragmatic hernia without obstruction or gangrene: Secondary | ICD-10-CM

## 2021-08-14 DIAGNOSIS — K5909 Other constipation: Secondary | ICD-10-CM | POA: Diagnosis not present

## 2021-08-14 DIAGNOSIS — K648 Other hemorrhoids: Secondary | ICD-10-CM

## 2021-08-14 DIAGNOSIS — K922 Gastrointestinal hemorrhage, unspecified: Secondary | ICD-10-CM | POA: Diagnosis not present

## 2021-08-14 HISTORY — PX: ESOPHAGOGASTRODUODENOSCOPY (EGD) WITH PROPOFOL: SHX5813

## 2021-08-14 HISTORY — PX: COLONOSCOPY WITH PROPOFOL: SHX5780

## 2021-08-14 LAB — CBC WITH DIFFERENTIAL/PLATELET
Abs Immature Granulocytes: 0.03 10*3/uL (ref 0.00–0.07)
Basophils Absolute: 0.1 10*3/uL (ref 0.0–0.1)
Basophils Relative: 1 %
Eosinophils Absolute: 0.3 10*3/uL (ref 0.0–0.5)
Eosinophils Relative: 6 %
HCT: 25.8 % — ABNORMAL LOW (ref 36.0–46.0)
Hemoglobin: 7.5 g/dL — ABNORMAL LOW (ref 12.0–15.0)
Immature Granulocytes: 1 %
Lymphocytes Relative: 21 %
Lymphs Abs: 1.1 10*3/uL (ref 0.7–4.0)
MCH: 25.7 pg — ABNORMAL LOW (ref 26.0–34.0)
MCHC: 29.1 g/dL — ABNORMAL LOW (ref 30.0–36.0)
MCV: 88.4 fL (ref 80.0–100.0)
Monocytes Absolute: 0.6 10*3/uL (ref 0.1–1.0)
Monocytes Relative: 11 %
Neutro Abs: 3.3 10*3/uL (ref 1.7–7.7)
Neutrophils Relative %: 60 %
Platelets: 179 10*3/uL (ref 150–400)
RBC: 2.92 MIL/uL — ABNORMAL LOW (ref 3.87–5.11)
RDW: 16.5 % — ABNORMAL HIGH (ref 11.5–15.5)
WBC: 5.4 10*3/uL (ref 4.0–10.5)
nRBC: 0 % (ref 0.0–0.2)

## 2021-08-14 SURGERY — ESOPHAGOGASTRODUODENOSCOPY (EGD) WITH PROPOFOL
Anesthesia: General

## 2021-08-14 MED ORDER — STERILE WATER FOR IRRIGATION IR SOLN
Status: DC | PRN
  Administered 2021-08-14: 50 mL

## 2021-08-14 MED ORDER — PROPOFOL 10 MG/ML IV BOLUS
INTRAVENOUS | Status: DC | PRN
  Administered 2021-08-14: 50 mg via INTRAVENOUS

## 2021-08-14 MED ORDER — SODIUM CHLORIDE 0.9 % IV SOLN: INTRAVENOUS | Status: DC

## 2021-08-14 MED ORDER — LACTATED RINGERS IV SOLN
INTRAVENOUS | Status: DC
  Administered 2021-08-14: 1000 mL via INTRAVENOUS

## 2021-08-14 MED ORDER — LIDOCAINE 2% (20 MG/ML) 5 ML SYRINGE
INTRAMUSCULAR | Status: DC | PRN
Start: 2021-08-14 — End: 2021-08-14
  Administered 2021-08-14: 50 mg via INTRAVENOUS

## 2021-08-14 MED ORDER — PROPOFOL 500 MG/50ML IV EMUL
INTRAVENOUS | Status: DC | PRN
  Administered 2021-08-14: 120 ug/kg/min via INTRAVENOUS

## 2021-08-14 NOTE — Anesthesia Preprocedure Evaluation (Signed)
Anesthesia Evaluation  ?Patient identified by MRN, date of birth, ID band ?Patient awake ? ? ? ?Reviewed: ?Allergy & Precautions, NPO status , Patient's Chart, lab work & pertinent test results ? ?Airway ?Mallampati: II ? ?TM Distance: >3 FB ?Neck ROM: Full ? ? ? Dental ? ?(+) Dental Advisory Given, Upper Dentures, Missing ?  ?Pulmonary ?neg pulmonary ROS,  ?  ?Pulmonary exam normal ?breath sounds clear to auscultation ? ? ? ? ? ? Cardiovascular ?Exercise Tolerance: Poor ?hypertension, Pt. on medications ?+ dysrhythmias Atrial Fibrillation  ?Rhythm:Irregular Rate:Normal ? ? ?  ?Neuro/Psych ?PSYCHIATRIC DISORDERS Dementia negative neurological ROS ?   ? GI/Hepatic ?negative GI ROS, Neg liver ROS,   ?Endo/Other  ?negative endocrine ROS ? Renal/GU ?negative Renal ROS  ?negative genitourinary ?  ?Musculoskeletal ? ?(+) Arthritis , Osteoarthritis,   ? Abdominal ?  ?Peds ?negative pediatric ROS ?(+)  Hematology ? ?(+) Blood dyscrasia, anemia ,   ?Anesthesia Other Findings ? ? Reproductive/Obstetrics ?negative OB ROS ? ?  ? ? ? ? ? ? ? ? ? ? ? ? ? ?  ?  ? ? ? ? ? ? ? ?Anesthesia Physical ?Anesthesia Plan ? ?ASA: 3 ? ?Anesthesia Plan: General  ? ?Post-op Pain Management: Minimal or no pain anticipated  ? ?Induction: Intravenous ? ?PONV Risk Score and Plan: TIVA ? ?Airway Management Planned: Nasal Cannula and Natural Airway ? ?Additional Equipment:  ? ?Intra-op Plan:  ? ?Post-operative Plan:  ? ?Informed Consent: I have reviewed the patients History and Physical, chart, labs and discussed the procedure including the risks, benefits and alternatives for the proposed anesthesia with the patient or authorized representative who has indicated his/her understanding and acceptance.  ? ? ? ?Dental advisory given and Consent reviewed with POA ? ?Plan Discussed with: CRNA and Surgeon ? ?Anesthesia Plan Comments:   ? ? ? ? ? ?Anesthesia Quick Evaluation ? ?

## 2021-08-14 NOTE — Anesthesia Postprocedure Evaluation (Signed)
Anesthesia Post Note ? ?Patient: Brittany Hanson ? ?Procedure(s) Performed: ESOPHAGOGASTRODUODENOSCOPY (EGD) WITH PROPOFOL ?COLONOSCOPY WITH PROPOFOL ? ?Patient location during evaluation: Phase II ?Anesthesia Type: General ?Level of consciousness: awake and alert and oriented ?Pain management: pain level controlled ?Vital Signs Assessment: post-procedure vital signs reviewed and stable ?Respiratory status: spontaneous breathing, nonlabored ventilation and respiratory function stable ?Cardiovascular status: blood pressure returned to baseline and stable ?Postop Assessment: no apparent nausea or vomiting ?Anesthetic complications: no ? ? ?No notable events documented. ? ? ?Last Vitals:  ?Vitals:  ? 08/14/21 1345 08/14/21 1400  ?BP: 118/64   ?Pulse: 84 77  ?Resp: (!) 23 16  ?Temp:    ?SpO2: 98% 95%  ?  ?Last Pain:  ?Vitals:  ? 08/14/21 1330  ?TempSrc:   ?PainSc: 0-No pain  ? ? ?  ?  ?  ?  ?  ?  ? ?Kristine Tiley C Stephane Junkins ? ? ? ? ?

## 2021-08-14 NOTE — Progress Notes (Signed)
?Subjective: ? ?Patient has no complaints.  She denies chest pain shortness of breath heartburn or dysphagia.  She also denies abdominal pain. ?According to her daughter Vaughan Basta who is at bedside her mother has been living there for 2 years.  She has been on anticoagulant for several years.  She has never had rectal bleeding until recently.  She has history of constipation and had been on MiraLAX.  She was having accidents and therefore MiraLAX dose was decreased.  No history of peptic ulcer disease.  Patient does not take OTC NSAIDs.  She takes Tylenol every day. ?Family history is negative for colorectal cancer. ? ?Current Medications: ? ?Current Facility-Administered Medications:  ?  0.9 %  sodium chloride infusion, , Intravenous, Continuous, Zierle-Ghosh, Asia B, DO, Last Rate: 75 mL/hr at 08/14/21 1010, New Bag at 08/14/21 1010 ?  0.9 %  sodium chloride infusion, , Intravenous, Continuous, Jodi Mourning, Kristen S, PA-C ?  [MAR Hold] acetaminophen (TYLENOL) tablet 650 mg, 650 mg, Oral, Q6H PRN **OR** [MAR Hold] acetaminophen (TYLENOL) suppository 650 mg, 650 mg, Rectal, Q6H PRN, Zierle-Ghosh, Asia B, DO ?  [MAR Hold] donepezil (ARICEPT) tablet 10 mg, 10 mg, Oral, QHS, Zierle-Ghosh, Asia B, DO, 10 mg at 08/13/21 2216 ?  [MAR Hold] hydrocortisone (ANUSOL-HC) suppository 25 mg, 25 mg, Rectal, BID, Annitta Needs, NP, 25 mg at 08/14/21 1009 ?  lactated ringers infusion, , Intravenous, Continuous, Battula, Rajamani C, MD, Last Rate: 50 mL/hr at 08/14/21 1213, 1,000 mL at 08/14/21 1213 ?  [MAR Hold] memantine (NAMENDA) tablet 5 mg, 5 mg, Oral, BID, Zierle-Ghosh, Asia B, DO, 5 mg at 08/14/21 1009 ?  [MAR Hold] morphine (PF) 2 MG/ML injection 2 mg, 2 mg, Intravenous, Q2H PRN, Zierle-Ghosh, Asia B, DO ?  [MAR Hold] ondansetron (ZOFRAN) tablet 4 mg, 4 mg, Oral, Q6H PRN **OR** [MAR Hold] ondansetron (ZOFRAN) injection 4 mg, 4 mg, Intravenous, Q6H PRN, Zierle-Ghosh, Asia B, DO ?  [MAR Hold] oxyCODONE (Oxy IR/ROXICODONE) immediate  release tablet 5 mg, 5 mg, Oral, Q4H PRN, Zierle-Ghosh, Asia B, DO ?  [MAR Hold] pantoprazole (PROTONIX) injection 40 mg, 40 mg, Intravenous, Q12H, Zierle-Ghosh, Asia B, DO, 40 mg at 08/14/21 1009 ?  [MAR Hold] polyethylene glycol (MIRALAX / GLYCOLAX) packet 17 g, 17 g, Oral, BID, Annitta Needs, NP, 17 g at 08/13/21 2094 ?  [MAR Hold] simvastatin (ZOCOR) tablet 20 mg, 20 mg, Oral, q1800, Zierle-Ghosh, Asia B, DO, 20 mg at 08/13/21 1752  ? ? ?Objective: ?Blood pressure 126/60, pulse 86, temperature 97.8 ?F (36.6 ?C), temperature source Oral, resp. rate (!) 26, height $RemoveBe'5\' 5"'FDugrpFWl$  (1.651 m), weight 73.6 kg, SpO2 97 %. ?Patient is alert and in no acute distress. ?She is very pale. ?Conjunctiva is pale. Sclera is nonicteric ?Oropharyngeal mucosa is dry. ?She is edentulous. ?No neck masses or thyromegaly noted. ?Cardiac exam with irregular rhythm normal S1 and S2. No murmur or gallop noted. ?Lungs are clear to auscultation. ?Abdomen is symmetrical soft and nontender with organomegaly or masses. ?Trace to 1+ pitting edema involving both legs left greater than right.  She has prominent superficial veins at right leg. ? ?Labs/studies Results: ? ? ? ?  Latest Ref Rng & Units 08/14/2021  ?  5:16 AM 08/13/2021  ?  5:43 AM 08/12/2021  ?  4:51 PM  ?CBC  ?WBC 4.0 - 10.5 K/uL 5.4   4.8   6.3    ?Hemoglobin 12.0 - 15.0 g/dL 7.5   7.5   8.4    ?Hematocrit 36.0 -  46.0 % 25.8   25.7   27.7    ?Platelets 150 - 400 K/uL 179   188   196    ?  ? ?  Latest Ref Rng & Units 08/13/2021  ?  5:43 AM 08/11/2021  ? 11:44 PM  ?CMP  ?Glucose 70 - 99 mg/dL 92   142    ?BUN 8 - 23 mg/dL 29   45    ?Creatinine 0.44 - 1.00 mg/dL 1.10   1.85    ?Sodium 135 - 145 mmol/L 143   138    ?Potassium 3.5 - 5.1 mmol/L 3.8   4.3    ?Chloride 98 - 111 mmol/L 113   103    ?CO2 22 - 32 mmol/L 23   24    ?Calcium 8.9 - 10.3 mg/dL 9.0   9.7    ?Total Protein 6.5 - 8.1 g/dL 5.1   6.9    ?Total Bilirubin 0.3 - 1.2 mg/dL 0.7   0.5    ?Alkaline Phos 38 - 126 U/L 51   72    ?AST 15 - 41  U/L 17   22    ?ALT 0 - 44 U/L 10   14    ?  ? ?  Latest Ref Rng & Units 08/13/2021  ?  5:43 AM 08/11/2021  ? 11:44 PM  ?Hepatic Function  ?Total Protein 6.5 - 8.1 g/dL 5.1   6.9    ?Albumin 3.5 - 5.0 g/dL 3.1   4.2    ?AST 15 - 41 U/L 17   22    ?ALT 0 - 44 U/L 10   14    ?Alk Phosphatase 38 - 126 U/L 51   72    ?Total Bilirubin 0.3 - 1.2 mg/dL 0.7   0.5    ?Bilirubin, Direct 0.0 - 0.2 mg/dL  0.1    ?  ? ?Assessment: ? ?#1.  Gastrointestinal bleeding.  Source appears to be colonic possibly diverticular.  Need to rule out colonic neoplasm.  Since she has iron deficiency anemia upper GI tract would also be examined looking for peptic ulcer disease GI angiodysplasia or other abnormalities. ?Both the procedures discussed with patient and her daughter and they are both agreeable. ?She appears to have stopped bleeding. ?Last apixaban dose was on 08/11/2021. ? ? ?#2.  Iron deficiency anemia.  Hemoglobin is down to 7.5 g.  She may need PRBC transfusion if hemoglobin drops any further. ? ? ?#3.  Atrial fibrillation.  Anticoagulant on hold in the setting of GI bleed. ? ?#4.  Patient has dementia.  Patient lives with her daughter but she has separate bedroom and bathroom and is ambulatory. ? ? ?Plan: ? ?Proceed with diagnostic esophagogastroduodenoscopy and colonoscopy under monitored anesthesia care. ? ? ? ? ? ?

## 2021-08-14 NOTE — Transfer of Care (Signed)
Immediate Anesthesia Transfer of Care Note ? ?Patient: Brittany Hanson ? ?Procedure(s) Performed: ESOPHAGOGASTRODUODENOSCOPY (EGD) WITH PROPOFOL ?COLONOSCOPY WITH PROPOFOL ? ?Patient Location: PACU ? ?Anesthesia Type:MAC ? ?Level of Consciousness: sedated and patient cooperative ? ?Airway & Oxygen Therapy: Patient Spontanous Breathing and Patient connected to nasal cannula oxygen ? ?Post-op Assessment: Report given to RN, Post -op Vital signs reviewed and stable and Patient moving all extremities ? ?Post vital signs: Reviewed and stable ? ?Last Vitals:  ?Vitals Value Taken Time  ?BP    ?Temp    ?Pulse    ?Resp    ?SpO2    ? ? ?Last Pain:  ?Vitals:  ? 08/14/21 1246  ?TempSrc:   ?PainSc: 0-No pain  ?   ? ?  ? ?Complications: No notable events documented. ?

## 2021-08-14 NOTE — Op Note (Signed)
[facility] ?Patient Name: Brittany Hanson ?Procedure Date: 08/14/2021 12:40 PM ?MRN: [id number] ?Date of Birth: May 03, 1932 ?Attending MD: Hildred Laser , MD ?CSN: [id number] ?Age: 88 ?Admit Type: Inpatient ?Procedure:                Upper GI endoscopy ?Indications:              Suspected upper gastrointestinal bleeding in  ?                          patient with unexplained iron deficiency anemia ?Providers:                Hildred Laser, MD, [place] Page, Raphael Gibney,  ?                          Technician ?Referring MD:             [place], DO ?Medicines:                Propofol per Anesthesia ?Complications:            No immediate complications. ?Estimated Blood Loss:     Estimated blood loss: none. ?Procedure:                Pre-Anesthesia Assessment: ?                          - Prior to the procedure, a History and Physical  ?                          was performed, and patient medications and  ?                          allergies were reviewed. The patient's tolerance of  ?                          previous anesthesia was also reviewed. The risks  ?                          and benefits of the procedure and the sedation  ?                          options and risks were discussed with the patient.  ?                          All questions were answered, and informed consent  ?                          was obtained. Prior Anticoagulants: The patient  ?                          last took Eliquis (apixaban) 3 days prior to the  ?                          procedure. ASA Grade Assessment: III - A patient  ?  with severe systemic disease. After reviewing the  ?                          risks and benefits, the patient was deemed in  ?                          satisfactory condition to undergo the procedure. ?                          After obtaining informed consent, the endoscope was  ?                          passed under direct vision. Throughout the  ?                           procedure, the patient's blood pressure, pulse, and  ?                          oxygen saturations were monitored continuously. The  ?                          GIF-[id number]) scope was introduced through the  ?                          mouth, and advanced to the second part of duodenum.  ?                          The upper GI endoscopy was accomplished without  ?                          difficulty. The patient tolerated the procedure  ?                          well. ?Scope In: 12:51:15 PM ?Scope Out: 12:57:18 PM ?Total Procedure Duration: 0 hours 6 minutes 3 seconds  ?Findings: ?     The hypopharynx was normal. ?     The examined esophagus was normal. ?     The Z-line was irregular and was found 35 cm from the incisors. ?     A 2 cm hiatal hernia was present. ?     The entire examined stomach was normal. ?     The duodenal bulb and second portion of the duodenum were normal. ?Impression:               - Normal hypopharynx. ?                          - Normal esophagus. ?                          - Z-line irregular, 35 cm from the incisors. ?                          - 2 cm hiatal hernia. ?                          -  Normal stomach. ?                          - Normal duodenal bulb and second portion of the  ?                          duodenum. ?                          - No specimens collected. ?Moderate Sedation: ?     Per Anesthesia Care ?Recommendation:           - See the other procedure note for documentation of  ?                          additional recommendations. ?Procedure Code(s):        --- Professional --- ?                          [phone], Esophagogastroduodenoscopy, flexible,  ?                          transoral; diagnostic, including collection of  ?                          specimen(s) by brushing or washing, when performed  ?                          (separate procedure) ?Diagnosis Code(s):        --- Professional --- ?                          K22.8, Other specified diseases of  esophagus ?                          K44.9, Diaphragmatic hernia without obstruction or  ?                          gangrene ?                          D50.9, Iron deficiency anemia, unspecified ?CPT copyright 2019 [employer]. All rights reserved. ?The codes documented in this report are preliminary and upon coder review may  ?be revised to meet current compliance requirements. ?Hildred Laser, MD ?Hildred Laser, MD ?08/14/2021 1:34:23 PM ?This report has been signed electronically. ?Number of Addenda: 0 ?

## 2021-08-14 NOTE — Plan of Care (Signed)

## 2021-08-14 NOTE — Progress Notes (Signed)
?PROGRESS NOTE ? ? ? ?Brittany Hanson  [id number] DOB: [birth year] DOA: 08/11/2021 ?PCP: Lindell Spar, MD ? ? ?Brief Narrative:  ?Brittany Hanson is a 88 y.o. female with medical history significant of with history of atrial fibrillation, dementia, hypertension who presented to ED with a chief complaint of blood per rectum. GI has been consulted. Her hemoglobin dropped from 9.4 to 8.2 and 7.5 today. No indication of transfusion she is hemodynamically stable as well. GI offered colonoscopy but patient declined. They have talked to the patient today. She is scheduled to have colonoscopy tomorrow. Eliquis on hold. ? ?Assessment & Plan: ?  ?Principal Problem: ?  GI bleed ?Active Problems: ?  Atrial fibrillation ([place]) ?  Essential hypertension ?  Dementia ([place]) ?  Chronic constipation ?  Elevated serum creatinine ?  Iron deficiency anemia ? ?Acute symptomatic GI bleed  ?Bright red blood per rectum  ?CT abdomen remarkable for constipation and stercoral colitis ?Known history of internal hemorrhoids ?GI following for upper and lower endoscopy later today ?Further evaluation and treatment pending procedure findings ?Continue to hold Eliquis, continue PPI ? ?AKI vs questionable CKD:  ?Unknown baseline ?Creatinine continues to improve, presumptive AKI ?  ?Chronic constipation ?Noncompliant with MiraLAX ?Continue to advance motility agents and laxatives as indicated ?  ?Dementia ([place]) ?-Continue Namenda/Aricept ?-At baseline per family at bedside ?  ?Essential hypertension ?-Continue to hold home medications -currently well controlled ?  ?Atrial fibrillation ([place]) ?-Hold Eliquis given rectal bleeding ?  ?  ?DVT prophylaxis: SCDs -holding Eliquis given above ?Code Status: Full Code  ?Family Communication: Daughter at bedside ? ?Status is: Inpatient ? ?Dispo: The patient is from: Home ?             Anticipated d/c is to: Home ?             Anticipated d/c date is: 24 to 48 hours pending above ?             Patient currently  not medically stable for discharge ? ?Consultants:  ?GI ? ?Procedures:  ?Upper/lower endoscopy 08/14/2021 ? ?Antimicrobials:  ?None ? ?Subjective: ?No acute issues or events overnight denies nausea vomiting diarrhea constipation headache fevers chills or chest pain ? ?Objective: ?Vitals:  ? 08/13/21 [id number] 08/13/21 1211 08/13/21 2056 08/14/21 [id number]  ?BP: 100/76 (!) 107/57 (!) 154/136 126/70  ?Pulse: 98 82 90 86  ?Resp: 18 16 18 19   ?Temp: 97.9 ?F (36.6 ?C) 98.2 ?F (36.8 ?C) 99.1 ?F (37.3 ?C) 98.2 ?F (36.8 ?C)  ?TempSrc: Oral   Oral  ?SpO2: 97% 97% 100% 96%  ?Weight:      ?Height:      ? ? ?Intake/Output Summary (Last 24 hours) at 08/14/2021 0655 ?Last data filed at 08/14/2021 0511 ?Gross per 24 hour  ?Intake 2080 ml  ?Output --  ?Net 2080 ml  ? ?Filed Weights  ? 08/11/21 2229 08/12/21 0643  ?Weight: 80.3 kg 73.6 kg  ? ? ?Examination: ? ?General:  Pleasantly resting in bed, No acute distress. ?HEENT:  Normocephalic atraumatic.  Sclerae nonicteric, noninjected.  Extraocular movements intact bilaterally. ?Neck:  Without mass or deformity.  Trachea is midline. ?Lungs:  Clear to auscultate bilaterally without rhonchi, wheeze, or rales. ?Heart:  Regular rate and rhythm.  Without murmurs, rubs, or gallops. ?Abdomen:  Soft, nontender, nondistended.  Without guarding or rebound. ?Extremities: Without cyanosis, clubbing, edema, or obvious deformity. ?Vascular:  Dorsalis pedis and posterior tibial pulses palpable bilaterally. ?Skin:  Warm and dry, no erythema,  no ulcerations. ? ?Data Reviewed: I have personally reviewed following labs and imaging studies ? ?CBC: ?Recent Labs  ?Lab 08/12/21 ?0603 08/12/21 ?1236 08/12/21 ?1651 08/13/21 ?[id number] 08/14/21 ?[id number]  ?WBC 8.6 8.4 6.3 4.8 5.4  ?NEUTROABS  --   --   --   --  3.3  ?HGB 8.2* 9.3* 8.4* 7.5* 7.5*  ?HCT 28.0* 31.0* 27.7* 25.7* 25.8*  ?MCV 84.3 84.9 85.2 87.1 88.4  ?PLT 208 262 196 188 179  ? ?Basic Metabolic Panel: ?Recent Labs  ?Lab 08/11/21 ?2344 08/13/21 ?0543  ?NA 138 143  ?K 4.3 3.8   ?CL 103 113*  ?CO2 24 23  ?GLUCOSE 142* 92  ?BUN 45* 29*  ?CREATININE 1.85* 1.10*  ?CALCIUM 9.7 9.0  ? ?GFR: ?Estimated Creatinine Clearance: 36.2 mL/min (A) (by C-G formula based on SCr of 1.1 mg/dL (H)). ?Liver Function Tests: ?Recent Labs  ?Lab 08/11/21 ?2344 08/13/21 ?0543  ?AST 22 17  ?ALT 14 10  ?ALKPHOS 72 51  ?BILITOT 0.5 0.7  ?PROT 6.9 5.1*  ?ALBUMIN 4.2 3.1*  ? ?No results for input(s): LIPASE, AMYLASE in the last 168 hours. ?No results for input(s): AMMONIA in the last 168 hours. ?Coagulation Profile: ?Recent Labs  ?Lab 08/11/21 ?2344  ?INR 1.7*  ? ?Cardiac Enzymes: ?No results for input(s): CKTOTAL, CKMB, CKMBINDEX, TROPONINI in the last 168 hours. ?BNP (last 3 results) ?No results for input(s): PROBNP in the last 8760 hours. ?HbA1C: ?No results for input(s): HGBA1C in the last 72 hours. ?CBG: ?No results for input(s): GLUCAP in the last 168 hours. ?Lipid Profile: ?No results for input(s): CHOL, HDL, LDLCALC, TRIG, CHOLHDL, LDLDIRECT in the last 72 hours. ?Thyroid Function Tests: ?No results for input(s): TSH, T4TOTAL, FREET4, T3FREE, THYROIDAB in the last 72 hours. ?Anemia Panel: ?Recent Labs  ?  08/11/21 ?2344 08/12/21 ?0603  ?[id number] 145*  --   ?FOLATE 31.8  --   ?FERRITIN 10*  --   ?TIBC 436  --   ?IRON 24*  --   ?RETICCTPCT  --  1.6  ? ?Sepsis Labs: ?No results for input(s): PROCALCITON, LATICACIDVEN in the last 168 hours. ? ?Recent Results (from the past 240 hour(s))  ?MRSA Next Gen by PCR, Nasal     Status: None  ? Collection Time: 08/13/21  4:58 PM  ? Specimen: Nasal Mucosa; Nasal Swab  ?Result Value Ref Range Status  ? MRSA by PCR Next Gen NOT DETECTED NOT DETECTED Final  ?  Comment: (NOTE) ?The GeneXpert MRSA Assay (FDA approved for NASAL specimens only), ?is one component of a comprehensive MRSA colonization surveillance ?program. It is not intended to diagnose MRSA infection nor to guide ?or monitor treatment for MRSA infections. ?Test performance is not FDA approved in patients less than  2 years ?old. ?Performed at [facility], [address], [place], [place] [address] ?  ? ?Radiology Studies: ?No results found. ? ?Scheduled Meds: ? donepezil  10 mg Oral QHS  ? hydrocortisone  25 mg Rectal BID  ? memantine  5 mg Oral BID  ? pantoprazole (PROTONIX) IV  40 mg Intravenous Q12H  ? polyethylene glycol  17 g Oral BID  ? simvastatin  20 mg Oral q1800  ? ?Continuous Infusions: ? sodium chloride 75 mL/hr at 08/14/21 0511  ? ? ? LOS: 1 day  ? ?Time spent: 33min ? ?Little Ishikawa, DO ?[facility] ? ?If 7PM-7AM, please contact night-coverage ?[email or web address] ? ?08/14/2021, 6:55 AM   ? ? ? ?

## 2021-08-14 NOTE — Op Note (Signed)
[facility] ?Patient Name: Brittany Hanson ?Procedure Date: 08/14/2021 12:39 PM ?MRN: [id number] ?Date of Birth: 1932-02-12 ?Attending MD: Hildred Laser , MD ?CSN: [id number] ?Age: 88 ?Admit Type: Inpatient ?Procedure:                Colonoscopy ?Indications:              Rectal bleeding, Iron deficiency anemia ?Providers:                Hildred Laser, MD, Auberry Page, Raphael Gibney,  ?                          Technician ?Referring MD:             [place], DO ?Medicines:                Propofol per Anesthesia ?Complications:            No immediate complications. ?Estimated Blood Loss:     Estimated blood loss: none. ?Procedure:                Pre-Anesthesia Assessment: ?                          - Prior to the procedure, a History and Physical  ?                          was performed, and patient medications and  ?                          allergies were reviewed. The patient's tolerance of  ?                          previous anesthesia was also reviewed. The risks  ?                          and benefits of the procedure and the sedation  ?                          options and risks were discussed with the patient.  ?                          All questions were answered, and informed consent  ?                          was obtained. Prior Anticoagulants: The patient  ?                          last took Eliquis (apixaban) 3 days prior to the  ?                          procedure. ASA Grade Assessment: III - A patient  ?                          with severe systemic disease. After reviewing the  ?  risks and benefits, the patient was deemed in  ?                          satisfactory condition to undergo the procedure. ?                          After obtaining informed consent, the colonoscope  ?                          was passed under direct vision. Throughout the  ?                          procedure, the patient's blood pressure, pulse, and  ?                           oxygen saturations were monitored continuously. The  ?                          [id number]) scope was introduced through  ?                          the anus and advanced to the the cecum, identified  ?                          by appendiceal orifice and ileocecal valve. The  ?                          colonoscopy was performed without difficulty. The  ?                          patient tolerated the procedure well. The quality  ?                          of the bowel preparation was good. The ileocecal  ?                          valve, appendiceal orifice, and rectum were  ?                          photographed. ?Scope In: 1:02:26 PM ?Scope Out: 1:22:12 PM ?Scope Withdrawal Time: 0 hours 8 minutes 1 second  ?Total Procedure Duration: 0 hours 19 minutes 46 seconds  ?Findings: ?     The perianal and digital rectal examinations were normal. ?     The colon (entire examined portion) appeared normal. ?     External and internal hemorrhoids were found during retroflexion. The  ?     hemorrhoids were medium-sized. ?Impression:               - The entire examined colon is normal. ?                          - External and internal hemorrhoids. ?                          - No specimens  collected. ?Moderate Sedation: ?     Per Anesthesia Care ?Recommendation:           - Return patient to hospital ward for ongoing care. ?                          - Clear liquid diet today. ?                          - Continue present medications. ?                          - To visualize the small bowel, perform video  ?                          capsule endoscopy tomorrow. ?Procedure Code(s):        --- Professional --- ?                          [phone], Colonoscopy, flexible; diagnostic, including  ?                          collection of specimen(s) by brushing or washing,  ?                          when performed (separate procedure) ?Diagnosis Code(s):        --- Professional --- ?                          [id number], Other  hemorrhoids ?                          K62.5, Hemorrhage of anus and rectum ?                          D50.9, Iron deficiency anemia, unspecified ?CPT copyright 2019 American Medical Association. All rights reserved. ?The codes documented in this report are preliminary and upon coder review may  ?be revised to meet current compliance requirements. ?Hildred Laser, MD ?Hildred Laser, MD ?08/14/2021 1:38:37 PM ?This report has been signed electronically. ?Number of Addenda: 0 ?

## 2021-08-14 NOTE — Progress Notes (Signed)
Brief EGD and colonoscopy notes ? ? ?EGD ? ?2 cm sliding hiatal hernia otherwise normal examination ? ? ?Colonoscopy ? ?Prominent internal/external hemorrhoids otherwise normal colonoscopy. ?

## 2021-08-15 ENCOUNTER — Encounter (HOSPITAL_COMMUNITY): Payer: Self-pay | Admitting: Family Medicine

## 2021-08-15 ENCOUNTER — Encounter (HOSPITAL_COMMUNITY)
Admission: EM | Disposition: A | Discharge status: Home Health | Payer: Self-pay | Source: Home / Self Care | Attending: Internal Medicine

## 2021-08-15 DIAGNOSIS — K5909 Other constipation: Secondary | ICD-10-CM | POA: Diagnosis not present

## 2021-08-15 DIAGNOSIS — G309 Alzheimer's disease, unspecified: Secondary | ICD-10-CM | POA: Diagnosis not present

## 2021-08-15 DIAGNOSIS — K922 Gastrointestinal hemorrhage, unspecified: Secondary | ICD-10-CM | POA: Diagnosis not present

## 2021-08-15 DIAGNOSIS — I4821 Permanent atrial fibrillation: Secondary | ICD-10-CM | POA: Diagnosis not present

## 2021-08-15 HISTORY — PX: GIVENS CAPSULE STUDY: SHX5432

## 2021-08-15 LAB — HEMOGLOBIN AND HEMATOCRIT, BLOOD
HCT: 30.6 % — ABNORMAL LOW (ref 36.0–46.0)
Hemoglobin: 9.1 g/dL — ABNORMAL LOW (ref 12.0–15.0)

## 2021-08-15 LAB — PREPARE RBC (CROSSMATCH)

## 2021-08-15 LAB — CBC
HCT: 26.4 % — ABNORMAL LOW (ref 36.0–46.0)
Hemoglobin: 7.3 g/dL — ABNORMAL LOW (ref 12.0–15.0)
MCH: 24.6 pg — ABNORMAL LOW (ref 26.0–34.0)
MCHC: 27.7 g/dL — ABNORMAL LOW (ref 30.0–36.0)
MCV: 88.9 fL (ref 80.0–100.0)
Platelets: 175 10*3/uL (ref 150–400)
RBC: 2.97 MIL/uL — ABNORMAL LOW (ref 3.87–5.11)
RDW: 16.1 % — ABNORMAL HIGH (ref 11.5–15.5)
WBC: 5.2 10*3/uL (ref 4.0–10.5)
nRBC: 0 % (ref 0.0–0.2)

## 2021-08-15 SURGERY — IMAGING PROCEDURE, GI TRACT, INTRALUMINAL, VIA CAPSULE

## 2021-08-15 MED ORDER — SODIUM CHLORIDE 0.9% IV SOLUTION: Freq: Once | INTRAVENOUS | Status: AC

## 2021-08-15 NOTE — Progress Notes (Signed)
PT Cancellation Note ? ?Patient Details ?Name: KASSIDY DOCKENDORF ?MRN: [id number] ?DOB: [birth year] ? ? ?Cancelled Treatment:    Reason Eval/Treat Not Completed: Patient at procedure or test/unavailable. Patient receiving blood transfusion, will check back tomorrow. ? ? ?3:12 PM, 08/15/21 ?Ocie Bob, MPT ?Physical Therapist with [place] ?[facility] ?[phone] office ?[phone] mobile phone ? ?

## 2021-08-15 NOTE — Progress Notes (Signed)
?PROGRESS NOTE ? ? ? ?Brittany HAEFELE  [id number] DOB: 17-Oct-1931 DOA: 08/11/2021 ?PCP: Lindell Spar, MD ? ? ?Brief Narrative:  ?Brittany Hanson is a 88 y.o. female with medical history significant of with history of atrial fibrillation, dementia, hypertension who presented to ED with a chief complaint of blood per rectum. GI has been consulted. Her hemoglobin dropped from 9.4 to 8.2 and 7.5 today. No indication of transfusion she is hemodynamically stable as well. GI offered colonoscopy but patient declined. They have talked to the patient today. She is scheduled to have colonoscopy tomorrow. Eliquis on hold. ? ?Assessment & Plan: ?  ?Principal Problem: ?  GI bleed ?Active Problems: ?  Atrial fibrillation ([place]) ?  Essential hypertension ?  Dementia ([place]) ?  Chronic constipation ?  Elevated serum creatinine ?  Iron deficiency anemia ? ?Acute symptomatic GI bleed  ?Bright red blood per rectum  ?CT abdomen remarkable for constipation and stercoral colitis ?Known history of internal hemorrhoids ?GI following for upper and lower endoscopy -without overt findings, internal hemorrhoids noted, likely source of patient's recent bleed ?1 unit PRBC for transfusion today given repeat labs and worsening symptoms of fatigue weakness and perceived hypothermia(although remains normothermic) ?Attempted pill endoscopy unsuccessful this morning. ?Continue to hold Eliquis, continue PPI ? ?AKI vs questionable CKD:  ?Unknown baseline ?Creatinine continues to improve, presumptive AKI ?  ?Chronic constipation ?Noncompliant with MiraLAX ?Continue to advance motility agents and laxatives as indicated ?  ?Dementia ([place]) ?-Continue Namenda/Aricept ?-At baseline per family at bedside ?  ?Essential hypertension ?-Continue to hold home medications -currently well controlled ?  ?Atrial fibrillation ([place]) ?-Hold Eliquis given rectal bleeding ?  ?  ?DVT prophylaxis: SCDs -holding Eliquis given above ?Code Status: Full Code  ?Family  Communication: Daughter at bedside ? ?Status is: Inpatient ? ?Dispo: The patient is from: Home ?             Anticipated d/c is to: Home ?             Anticipated d/c date is: 24 to 48 hours pending above ?             Patient currently not medically stable for discharge ? ?Consultants:  ?GI ? ?Procedures:  ?Upper/lower endoscopy 08/14/2021 ? ?Antimicrobials:  ?None ? ?Subjective: ?No acute issues or events overnight denies nausea vomiting diarrhea constipation headache fevers chills or chest pain ? ?Objective: ?Vitals:  ? 08/14/21 1400 08/14/21 1430 08/14/21 2029 08/15/21 0456  ?BP:  134/60 (!) 115/55 139/66  ?Pulse: 77 81 75 77  ?Resp: 16 18 19 17   ?Temp:  (!) 97.5 ?F (36.4 ?C) 98.1 ?F (36.7 ?C) 98.1 ?F (36.7 ?C)  ?TempSrc:  Oral Oral Oral  ?SpO2: 95%  95% 96%  ?Weight:      ?Height:      ? ? ?Intake/Output Summary (Last 24 hours) at 08/15/2021 1108 ?Last data filed at 08/14/2021 1849 ?Gross per 24 hour  ?Intake 1165.19 ml  ?Output 650 ml  ?Net 515.19 ml  ? ? ?Filed Weights  ? 08/11/21 2229 08/12/21 0643  ?Weight: 80.3 kg 73.6 kg  ? ? ?Examination: ? ?General:  Pleasantly resting in bed, No acute distress. ?HEENT:  Normocephalic atraumatic.  Sclerae nonicteric, noninjected.  Extraocular movements intact bilaterally. ?Neck:  Without mass or deformity.  Trachea is midline. ?Lungs:  Clear to auscultate bilaterally without rhonchi, wheeze, or rales. ?Heart:  Regular rate and rhythm.  Without murmurs, rubs, or gallops. ?Abdomen:  Soft, nontender, nondistended.  Without  guarding or rebound. ?Extremities: Without cyanosis, clubbing, edema, or obvious deformity. ?Vascular:  Dorsalis pedis and posterior tibial pulses palpable bilaterally. ?Skin:  Warm and dry, no erythema, no ulcerations. ? ?Data Reviewed: I have personally reviewed following labs and imaging studies ? ?CBC: ?Recent Labs  ?Lab 08/12/21 ?1236 08/12/21 ?1651 08/13/21 ?[id number] 08/14/21 ?[id number] 08/15/21 ?0606  ?WBC 8.4 6.3 4.8 5.4 5.2  ?NEUTROABS  --   --   --  3.3  --    ?HGB 9.3* 8.4* 7.5* 7.5* 7.3*  ?HCT 31.0* 27.7* 25.7* 25.8* 26.4*  ?MCV 84.9 85.2 87.1 88.4 88.9  ?PLT 262 196 188 179 175  ? ? ?Basic Metabolic Panel: ?Recent Labs  ?Lab 08/11/21 ?2344 08/13/21 ?0543  ?NA 138 143  ?K 4.3 3.8  ?CL 103 113*  ?CO2 24 23  ?GLUCOSE 142* 92  ?BUN 45* 29*  ?CREATININE 1.85* 1.10*  ?CALCIUM 9.7 9.0  ? ? ?GFR: ?Estimated Creatinine Clearance: 36.2 mL/min (A) (by C-G formula based on SCr of 1.1 mg/dL (H)). ?Liver Function Tests: ?Recent Labs  ?Lab 08/11/21 ?2344 08/13/21 ?0543  ?AST 22 17  ?ALT 14 10  ?ALKPHOS 72 51  ?BILITOT 0.5 0.7  ?PROT 6.9 5.1*  ?ALBUMIN 4.2 3.1*  ? ? ?No results for input(s): LIPASE, AMYLASE in the last 168 hours. ?No results for input(s): AMMONIA in the last 168 hours. ?Coagulation Profile: ?Recent Labs  ?Lab 08/11/21 ?2344  ?INR 1.7*  ? ? ?Cardiac Enzymes: ?No results for input(s): CKTOTAL, CKMB, CKMBINDEX, TROPONINI in the last 168 hours. ?BNP (last 3 results) ?No results for input(s): PROBNP in the last 8760 hours. ?HbA1C: ?No results for input(s): HGBA1C in the last 72 hours. ?CBG: ?No results for input(s): GLUCAP in the last 168 hours. ?Lipid Profile: ?No results for input(s): CHOL, HDL, LDLCALC, TRIG, CHOLHDL, LDLDIRECT in the last 72 hours. ?Thyroid Function Tests: ?No results for input(s): TSH, T4TOTAL, FREET4, T3FREE, THYROIDAB in the last 72 hours. ?Anemia Panel: ?No results for input(s): VITAMINB12, FOLATE, FERRITIN, TIBC, IRON, RETICCTPCT in the last 72 hours. ? ?Sepsis Labs: ?No results for input(s): PROCALCITON, LATICACIDVEN in the last 168 hours. ? ?Recent Results (from the past 240 hour(s))  ?MRSA Next Gen by PCR, Nasal     Status: None  ? Collection Time: 08/13/21  4:58 PM  ? Specimen: Nasal Mucosa; Nasal Swab  ?Result Value Ref Range Status  ? MRSA by PCR Next Gen NOT DETECTED NOT DETECTED Final  ?  Comment: (NOTE) ?The GeneXpert MRSA Assay (FDA approved for NASAL specimens only), ?is one component of a comprehensive MRSA colonization  surveillance ?program. It is not intended to diagnose MRSA infection nor to guide ?or monitor treatment for MRSA infections. ?Test performance is not FDA approved in patients less than 2 years ?old. ?Performed at [facility], [address], [place], [place] [address] ?  ? ?Radiology Studies: ?No results found. ? ?Scheduled Meds: ? sodium chloride   Intravenous Once  ? donepezil  10 mg Oral QHS  ? hydrocortisone  25 mg Rectal BID  ? memantine  5 mg Oral BID  ? pantoprazole (PROTONIX) IV  40 mg Intravenous Q12H  ? polyethylene glycol  17 g Oral BID  ? simvastatin  20 mg Oral q1800  ? ?Continuous Infusions: ? sodium chloride 75 mL/hr at 08/15/21 0349  ? ? ? LOS: 2 days  ? ?Time spent: 74min ? ?Little Ishikawa, DO ?[facility] ? ?If 7PM-7AM, please contact night-coverage ?[email or web address] ? ?08/15/2021, 11:08 AM   ? ? ? ?

## 2021-08-15 NOTE — OR Nursing (Signed)
In room to administer givens capsule for givens capsule study scheduled for today. Niece at bedside. Attempted to administer givens capsule. After multiple attempts, patient drank a half cup of water trying to swallow capsule. Unable to swallow capsule. Patient stated, "I can chew it but I can't swallow it." Dr. Gala Romney notified and aware. Primary Nurse informed of unsuccessful attempt. Informed nurse would discuss with provider for further instructions concerning diet. Nurse verbalized understanding.  ?

## 2021-08-15 NOTE — Progress Notes (Addendum)
? ?Gastroenterology Progress Note  ? ?Referring Provider: No ref. provider found ?Primary Care Physician:  Lindell Spar, MD ?Primary Gastroenterologist:  Dr. Abbey Chatters (newly assigned) ? ?Patient ID: Brittany Hanson; [id number]; 07/06/1931  ? ? ?Subjective  ? ?Patient reports no further episodes of bleeding or melena. Continues to deny any abdominal pain or N/V. Bms have been softer. She reports that she is hungry and would like to eat more regular foods. Denies dizziness, chespt pain, or shortness of breath. Daughter at bedside who also notes that she has not observed any further bleeding as well.  ? ? ?Objective  ? ?Vital signs in last 24 hours ?Temp:  [97.5 ?F (36.4 ?C)-98.2 ?F (36.8 ?C)] 98.1 ?F (36.7 ?C) (04/06 0456) ?Pulse Rate:  [75-98] 77 (04/06 0456) ?Resp:  [16-26] 17 (04/06 0456) ?BP: (101-139)/(55-66) 139/66 (04/06 0456) ?SpO2:  [95 %-99 %] 96 % (04/06 0456) ?Last BM Date : 08/14/21 ? ?Physical Exam ?General:   Alert and oriented, pleasant ?Head:  Normocephalic and atraumatic. ?Eyes:  No icterus, sclera clear. Conjuctiva pink.  ?Mouth:  Without lesions, mucosa pink and moist.  ?Heart:  S1, S2 present, no murmurs noted.  ?Lungs: Clear to auscultation bilaterally, without wheezing, rales, or rhonchi.  ?Abdomen:  Bowel sounds present, soft, non-tender, non-distended. No HSM or hernias noted. No rebound or guarding. No masses appreciated  ?Msk:  Symmetrical without gross deformities. Normal posture. ?Extremities:  Without clubbing or edema. ?Neurologic:  Alert and  oriented x4;  grossly normal neurologically. ?Skin:  Warm and dry, intact without significant lesions.  ?Psych:  Alert and cooperative. Normal mood and affect. ? ?Intake/Output from previous day: ?04/05 0701 - 04/06 0700 ?In: 1165.2 [P.O.:360; I.V.:805.2] ?Out: 650 [Urine:650] ?Intake/Output this shift: ?No intake/output data recorded. ? ?Lab Results ? ?Recent Labs  ?  08/13/21 ?[id number] 08/14/21 ?[id number] 08/15/21 ?0606  ?WBC 4.8 5.4 5.2  ?HGB 7.5* 7.5*  7.3*  ?HCT 25.7* 25.8* 26.4*  ?PLT 188 179 175  ? ?BMET ?Recent Labs  ?  08/13/21 ?0543  ?NA 143  ?K 3.8  ?CL 113*  ?CO2 23  ?GLUCOSE 92  ?BUN 29*  ?CREATININE 1.10*  ?CALCIUM 9.0  ? ?LFT ?Recent Labs  ?  08/13/21 ?0543  ?PROT 5.1*  ?ALBUMIN 3.1*  ?AST 17  ?ALT 10  ?ALKPHOS 51  ?BILITOT 0.7  ? ?PT/INR ?No results for input(s): LABPROT, INR in the last 72 hours. ?Hepatitis Panel ?No results for input(s): HEPBSAG, HCVAB, HEPAIGM, HEPBIGM in the last 72 hours. ? ? ?Studies/Results ?CT ABDOMEN PELVIS WO CONTRAST ? ?Result Date: 08/12/2021 ?CLINICAL DATA:  88 year old female with rectal bleeding. Dark stools. EXAM: CT ABDOMEN AND PELVIS WITHOUT CONTRAST TECHNIQUE: Multidetector CT imaging of the abdomen and pelvis was performed following the standard protocol without IV contrast. RADIATION DOSE REDUCTION: This exam was performed according to the departmental dose-optimization program which includes automated exposure control, adjustment of the mA and/or kV according to patient size and/or use of iterative reconstruction technique. COMPARISON:  None. FINDINGS: Lower chest: Bronchiectasis in the lower lobes. Peribronchial linear lower lobe scarring or atelectasis. No lung base pneumonia or pleural effusion. Mild cardiomegaly. Trace physiologic appearing pericardial fluid. Hepatobiliary: Negative noncontrast liver and gallbladder. Pancreas: Fatty atrophy. Spleen: Negative. Adrenals/Urinary Tract: Normal adrenal glands. Nonobstructed kidneys. No nephrolithiasis. Occasional small benign appearing cysts with simple fluid density (no imaging follow-up recommended). No hydroureter. Unremarkable bladder. Pelvic phleboliths. Stomach/Bowel: Large stool ball in the rectum, 8 cm diameter (sagittal image 80). Circumferential mild rectal wall thickening there. Stool  tapers in the distal sigmoid colon. Redundant sigmoid in the pelvis is otherwise decompressed. Mild retained stool in the descending colon. Gas and retained stool in the  transverse colon. Oral contrast has just reached the hepatic flexure. Appendix remains normal on coronal image 47. No large bowel diverticulosis is identified. Negative terminal ileum. No dilated small bowel. Decompressed stomach and duodenum. No free air or free fluid. Vascular/Lymphatic: Aortoiliac calcified atherosclerosis. Mildly tortuous abdominal aorta and iliac arteries. Vascular patency is not evaluated in the absence of IV contrast. No lymphadenopathy identified. Reproductive: Negative noncontrast appearance. Other: No pelvic free fluid. Oval left anterior pelvic floor phlebolith or postinflammatory calcified lymph node. Musculoskeletal: Advanced lumbar spine degeneration with levoconvex scoliosis and multilevel spondylolisthesis. Widespread lumbar vacuum disc, endplate spurring and sclerosis. Very severe bilateral hip osteoarthritis. No acute osseous abnormality identified. IMPRESSION: 1. Large stool ball in the rectum with mild associated rectal wall thickening. Consider fecal impaction, Stercoral Colitis. No large bowel diverticulosis. Oral contrast has reached the hepatic flexure. Appendix is normal. 2. No other acute or inflammatory process identified in the non-contrast abdomen or pelvis. 3. Bronchiectasis in both lower lobes. Advanced lumbar spine degeneration. Very severe bilateral hip osteoarthritis.Aortic Atherosclerosis (ICD10-I70.0). Electronically Signed   By: Genevie Ann M.D.   On: 08/12/2021 04:29   ? ?Assessment  ?88 y.o. female with a history of dementia, afib on eliquis, chronic constipation presented to the ED on 4/2 due to rectal bleeding. CT A/P without contrast noted large stool ball in the rectum with mild associated wall thickening (possible stercoral colitis).  Gi consulted for further evaluation of bleeding and anemia.  ? ?Rectal Bleeding: Patient presented with toilet tissue hematochezia and observed blood in the toilet by her daughter. She is on miralax at home however had only been  receiving it about once per week lately as she cut back due to incontinence. Likely rectal bleeding multifactorial in the setting on fecal impaction with suspected stercoral colitis, and internal hemorrhoids. There were reports of dark maroon blood mixed with stool in the ED after disimpaction attempt. Bowels are reportedly moving well now with Miralax BID and prior suppository. Receiving anusol suppositories BID which will continue for total 7 day therapy. Colonoscopy this admission only showing internal and external hemorrhoids. Hgb 7.3 today, 7.5 yesterday. No episodes of bleeding or melena overnight or today. Will continue to monitor CBC . Continue Miralax twice daily. Likely bleeding related to fecal impaction however unable to rule out small bowel AVMs as unable to complete capsule study. Defer to hospitalist for restarting anticoagulation and monitor for further bleeding.  ? ? ?Microcytic Anemia: Labs with iron and B12 deficiency. Hgb 9.4 on admission. Iron 24, saturation 6%, B12 145. INR 1.7. Hgb has fluctuated down to 8.2 then up to 9.3 and back down. Currently has remained 7.5 to 7.3. Underwent EGD and colonoscopy yesterday 4/5. EGD with normal esophagus, 2cm hiatal hernia, normal stomach and duodenum. Colonoscopy normal with external and internal hemorrhoids. Patient was to undergo capsule study today however was unable to swallow the pill. Spoke with patients daughter regarding need to undergo repeat EGD to place the capsule in order to evaluate the small bowel for source of bleeding for which she declined to put her mother through another procedure at this time. If anticoagulation indicated may restart and continue to monitor for any overt signs of bleeding and significant Hgb drop and consider CTA at that time vs repeat EGD/capsule. Recommend B12 supplementation. Will need close follow up of H/H outpatient.  ? ? ? ?  Plan / Recommendations  ?Continue to monitor CBC, transfuse for Hgb <7. ?If  anticoagulation indicated may restart and continue to monitor for overt GI bleeding ?Consider CTA if overt bleeding or large drop in hemoglobin vs repeat EGD/capsule ?Advance to soft diet.  ?Continue Miralax twice da

## 2021-08-16 ENCOUNTER — Telehealth: Payer: Self-pay | Admitting: Internal Medicine

## 2021-08-16 DIAGNOSIS — R69 Illness, unspecified: Secondary | ICD-10-CM | POA: Diagnosis not present

## 2021-08-16 DIAGNOSIS — K5909 Other constipation: Secondary | ICD-10-CM | POA: Diagnosis not present

## 2021-08-16 DIAGNOSIS — D509 Iron deficiency anemia, unspecified: Secondary | ICD-10-CM | POA: Diagnosis not present

## 2021-08-16 DIAGNOSIS — K922 Gastrointestinal hemorrhage, unspecified: Secondary | ICD-10-CM | POA: Diagnosis not present

## 2021-08-16 DIAGNOSIS — R7989 Other specified abnormal findings of blood chemistry: Secondary | ICD-10-CM | POA: Diagnosis not present

## 2021-08-16 DIAGNOSIS — G309 Alzheimer's disease, unspecified: Secondary | ICD-10-CM | POA: Diagnosis not present

## 2021-08-16 DIAGNOSIS — I4821 Permanent atrial fibrillation: Secondary | ICD-10-CM | POA: Diagnosis not present

## 2021-08-16 DIAGNOSIS — I10 Essential (primary) hypertension: Secondary | ICD-10-CM | POA: Diagnosis not present

## 2021-08-16 LAB — CBC
HCT: 29.8 % — ABNORMAL LOW (ref 36.0–46.0)
Hemoglobin: 8.7 g/dL — ABNORMAL LOW (ref 12.0–15.0)
MCH: 25.4 pg — ABNORMAL LOW (ref 26.0–34.0)
MCHC: 29.2 g/dL — ABNORMAL LOW (ref 30.0–36.0)
MCV: 86.9 fL (ref 80.0–100.0)
Platelets: 173 10*3/uL (ref 150–400)
RBC: 3.43 MIL/uL — ABNORMAL LOW (ref 3.87–5.11)
RDW: 15.9 % — ABNORMAL HIGH (ref 11.5–15.5)
WBC: 6.1 10*3/uL (ref 4.0–10.5)
nRBC: 0 % (ref 0.0–0.2)

## 2021-08-16 LAB — TYPE AND SCREEN
ABO/RH(D): O POS
Antibody Screen: NEGATIVE
Unit division: 0

## 2021-08-16 LAB — BPAM RBC
Blood Product Expiration Date: 202305112359
ISSUE DATE / TIME: 202304061414
Unit Type and Rh: 5100

## 2021-08-16 MED ORDER — POLYETHYLENE GLYCOL 3350 17 G PO PACK: 17.0000 g | PACK | Freq: Two times a day (BID) | ORAL | 0 refills | Status: DC

## 2021-08-16 MED ORDER — PANTOPRAZOLE SODIUM 40 MG PO TBEC
40.0000 mg | DELAYED_RELEASE_TABLET | Freq: Two times a day (BID) | ORAL | Status: DC
Start: 2021-08-16 — End: 2021-08-16
  Administered 2021-08-16: 40 mg via ORAL
  Filled 2021-08-16: qty 1

## 2021-08-16 MED ORDER — HYDROCORTISONE ACETATE 25 MG RE SUPP: 25.0000 mg | Freq: Two times a day (BID) | RECTAL | 0 refills | Status: DC

## 2021-08-16 MED ORDER — ELIQUIS 5 MG PO TABS: 5.0000 mg | ORAL_TABLET | Freq: Two times a day (BID) | ORAL | 0 refills | Status: DC

## 2021-08-16 NOTE — Progress Notes (Signed)
Nsg Discharge Note ? ?Admit Date:  08/11/2021 ?Discharge date: 08/16/2021 ?  ?Jessica A Almon to be D/C'd Home per MD order.  AVS completed.   ?Patient/caregiver able to verbalize understanding. ? ?Discharge Medication: ?Allergies as of 08/16/2021   ?No Known Allergies ?  ? ?  ?Medication List  ?  ? ?TAKE these medications   ? ?donepezil 10 MG tablet ?Commonly known as: ARICEPT ?Take 1 tablet (10 mg total) by mouth at bedtime. ?  ?Eliquis 5 MG Tabs tablet ?Generic drug: apixaban ?Take 1 tablet (5 mg total) by mouth 2 (two) times daily. ?Start taking on: August 22, 2021 ?What changed: These instructions start on August 22, 2021. If you are unsure what to do until then, ask your doctor or other care provider. ?  ?hydrochlorothiazide 25 MG tablet ?Commonly known as: HYDRODIURIL ?TAKE 1 TABLET (25 MG TOTAL) BY MOUTH DAILY. ?  ?hydrocortisone 25 MG suppository ?Commonly known as: ANUSOL-HC ?Place 1 suppository (25 mg total) rectally 2 (two) times daily. ?  ?latanoprost 0.005 % ophthalmic solution ?Commonly known as: XALATAN ?Place 1 drop into both eyes at bedtime. ?  ?losartan 100 MG tablet ?Commonly known as: COZAAR ?TAKE 1 TABLET BY MOUTH EVERY DAY ?  ?memantine 5 MG tablet ?Commonly known as: NAMENDA ?Take 5 mg by mouth 2 (two) times daily. ?  ?metoprolol succinate 50 MG 24 hr tablet ?Commonly known as: TOPROL-XL ?TAKE 1 TABLET BY MOUTH EVERY DAY ?  ?polyethylene glycol 17 g packet ?Commonly known as: MIRALAX / GLYCOLAX ?Take 17 g by mouth 2 (two) times daily. ?What changed:  ?when to take this ?reasons to take this ?  ?simvastatin 20 MG tablet ?Commonly known as: ZOCOR ?TAKE 1 TABLET BY MOUTH EVERYDAY AT BEDTIME ?What changed: See the new instructions. ?  ?TYLENOL ARTHRITIS PAIN PO ?Take 2 tablets by mouth in the morning. ?  ? ?  ? ? ?Discharge Assessment: ?Vitals:  ? 08/16/21 0606 08/16/21 1252  ?BP: (!) 154/76 122/69  ?Pulse: 85 76  ?Resp: 20 17  ?Temp: (!) 97.5 ?F (36.4 ?C) 98.6 ?F (37 ?C)  ?SpO2: 94% 94%  ? Skin clean,  dry and intact without evidence of skin break down, no evidence of skin tears noted. ?IV catheter discontinued intact. Site without signs and symptoms of complications - no redness or edema noted at insertion site, patient denies c/o pain - only slight tenderness at site.  Dressing with slight pressure applied. ? ?D/c Instructions-Education: ?Discharge instructions given to patient/family with verbalized understanding. ?D/c education completed with patient/family including follow up instructions, medication list, d/c activities limitations if indicated, with other d/c instructions as indicated by MD - patient able to verbalize understanding, all questions fully answered. ?Patient instructed to return to ED, call 911, or call MD for any changes in condition.  ?Patient escorted via WC, and D/C home via private auto. ? ?Verl Dicker, RN ?08/16/2021 2:22 PM  ?

## 2021-08-16 NOTE — Discharge Summary (Signed)
Physician Discharge Summary  ?[name] [id number] DOB: [birth year] DOA: 08/11/2021 ? ?PCP: Lindell Spar, MD ? ?Admit date: 08/11/2021 ?Discharge date: 08/16/2021 ? ?Admitted From: Home ?Disposition: Home ? ?Recommendations for Outpatient Follow-up:  ?Follow up with PCP in 1-2 weeks ?Please obtain CBC in 1 week ?Follow-up with GI as scheduled ? ?Home Health: Home health PT ?Equipment/Devices: None ? ?Discharge Condition: Stable ?CODE STATUS: Full ?Diet recommendation: Soft diet per GI ? ?Brief/Interim Summary: ?Brittany Hanson is a 88 y.o. female with medical history significant of with history of atrial fibrillation, dementia, hypertension who presented to ED with a chief complaint of blood per rectum. GI has been consulted. Her hemoglobin dropped from 9.4 to 8.2 and 7.5 today. No indication of transfusion she is hemodynamically stable as well. GI offered colonoscopy but patient declined. They have talked to the patient today. She is scheduled to have colonoscopy tomorrow. Eliquis on hold. ? ?Status post upper and lower endoscopy notable for internal hemorrhoids likely source of GI bleed.  Hemoglobin has stabilized, after 1 unit PRBC remains between 8.5 and 9 with resolution of symptoms previously mentioned including fatigue weakness and subjective hyperthermia.  GI following and appreciate insight and recommendations, continue soft diet, MiraLAX twice daily and Anusol suppository twice daily for 7 days, also continue PPI twice daily as directed.  At this time patient is tolerating p.o. well, close follow-up outpatient with PCP, repeat labs as outlined next week as above.  Hold Eliquis for additional 5 days as discussed to ensure healing of hemorrhoids.  Please report back to the hospital or urgent care if repeat large bloody bowel movement. ? ?Discharge Diagnoses:  ?Principal Problem: ?  GI bleed ?Active Problems: ?  Atrial fibrillation ([place]) ?  Essential hypertension ?  Dementia ([place]) ?  Chronic  constipation ?  Elevated serum creatinine ?  Iron deficiency anemia ? ? ? ?Discharge Instructions ? ? ?Allergies as of 08/16/2021   ?No Known Allergies ?  ? ?  ?Medication List  ?  ? ?TAKE these medications   ? ?donepezil 10 MG tablet ?Commonly known as: ARICEPT ?Take 1 tablet (10 mg total) by mouth at bedtime. ?  ?Eliquis 5 MG Tabs tablet ?Generic drug: apixaban ?Take 1 tablet (5 mg total) by mouth 2 (two) times daily. ?Start taking on: August 22, 2021 ?What changed: These instructions start on August 22, 2021. If you are unsure what to do until then, ask your doctor or other care provider. ?  ?hydrochlorothiazide 25 MG tablet ?Commonly known as: HYDRODIURIL ?TAKE 1 TABLET (25 MG TOTAL) BY MOUTH DAILY. ?  ?hydrocortisone 25 MG suppository ?Commonly known as: ANUSOL-HC ?Place 1 suppository (25 mg total) rectally 2 (two) times daily. ?  ?latanoprost 0.005 % ophthalmic solution ?Commonly known as: XALATAN ?Place 1 drop into both eyes at bedtime. ?  ?losartan 100 MG tablet ?Commonly known as: COZAAR ?TAKE 1 TABLET BY MOUTH EVERY DAY ?  ?memantine 5 MG tablet ?Commonly known as: NAMENDA ?Take 5 mg by mouth 2 (two) times daily. ?  ?metoprolol succinate 50 MG 24 hr tablet ?Commonly known as: TOPROL-XL ?TAKE 1 TABLET BY MOUTH EVERY DAY ?  ?polyethylene glycol 17 g packet ?Commonly known as: MIRALAX / GLYCOLAX ?Take 17 g by mouth 2 (two) times daily. ?What changed:  ?when to take this ?reasons to take this ?  ?simvastatin 20 MG tablet ?Commonly known as: ZOCOR ?TAKE 1 TABLET BY MOUTH EVERYDAY AT BEDTIME ?What changed: See the new instructions. ?  ?TYLENOL ARTHRITIS  PAIN PO ?Take 2 tablets by mouth in the morning. ?  ? ?  ? ? ?No Known Allergies ? ?Consultations: ?GI ? ?Procedures/Studies: ?CT ABDOMEN PELVIS WO CONTRAST ? ?Result Date: 08/12/2021 ?CLINICAL DATA:  88 year old female with rectal bleeding. Dark stools. EXAM: CT ABDOMEN AND PELVIS WITHOUT CONTRAST TECHNIQUE: Multidetector CT imaging of the abdomen and pelvis was  performed following the standard protocol without IV contrast. RADIATION DOSE REDUCTION: This exam was performed according to the departmental dose-optimization program which includes automated exposure control, adjustment of the mA and/or kV according to patient size and/or use of iterative reconstruction technique. COMPARISON:  None. FINDINGS: Lower chest: Bronchiectasis in the lower lobes. Peribronchial linear lower lobe scarring or atelectasis. No lung base pneumonia or pleural effusion. Mild cardiomegaly. Trace physiologic appearing pericardial fluid. Hepatobiliary: Negative noncontrast liver and gallbladder. Pancreas: Fatty atrophy. Spleen: Negative. Adrenals/Urinary Tract: Normal adrenal glands. Nonobstructed kidneys. No nephrolithiasis. Occasional small benign appearing cysts with simple fluid density (no imaging follow-up recommended). No hydroureter. Unremarkable bladder. Pelvic phleboliths. Stomach/Bowel: Large stool ball in the rectum, 8 cm diameter (sagittal image 80). Circumferential mild rectal wall thickening there. Stool tapers in the distal sigmoid colon. Redundant sigmoid in the pelvis is otherwise decompressed. Mild retained stool in the descending colon. Gas and retained stool in the transverse colon. Oral contrast has just reached the hepatic flexure. Appendix remains normal on coronal image 47. No large bowel diverticulosis is identified. Negative terminal ileum. No dilated small bowel. Decompressed stomach and duodenum. No free air or free fluid. Vascular/Lymphatic: Aortoiliac calcified atherosclerosis. Mildly tortuous abdominal aorta and iliac arteries. Vascular patency is not evaluated in the absence of IV contrast. No lymphadenopathy identified. Reproductive: Negative noncontrast appearance. Other: No pelvic free fluid. Oval left anterior pelvic floor phlebolith or postinflammatory calcified lymph node. Musculoskeletal: Advanced lumbar spine degeneration with levoconvex scoliosis and  multilevel spondylolisthesis. Widespread lumbar vacuum disc, endplate spurring and sclerosis. Very severe bilateral hip osteoarthritis. No acute osseous abnormality identified. IMPRESSION: 1. Large stool ball in the rectum with mild associated rectal wall thickening. Consider fecal impaction, Stercoral Colitis. No large bowel diverticulosis. Oral contrast has reached the hepatic flexure. Appendix is normal. 2. No other acute or inflammatory process identified in the non-contrast abdomen or pelvis. 3. Bronchiectasis in both lower lobes. Advanced lumbar spine degeneration. Very severe bilateral hip osteoarthritis.Aortic Atherosclerosis (ICD10-I70.0). Electronically Signed   By: Odessa Fleming M.D.   On: 08/12/2021 04:29   ? ? ?Subjective: No acute issues or events overnight tolerating p.o. well denies nausea vomiting diarrhea constipation headache fevers chills or chest pain ? ? ?Discharge Exam: ?Vitals:  ? 08/15/21 2135 08/16/21 0606  ?BP: (!) 141/78 (!) 154/76  ?Pulse: 83 85  ?Resp: 20 20  ?Temp: 98.4 ?F (36.9 ?C) (!) 97.5 ?F (36.4 ?C)  ?SpO2: 95% 94%  ? ?Vitals:  ? 08/15/21 1447 08/15/21 1744 08/15/21 2135 08/16/21 0606  ?BP: (!) 124/53 138/61 (!) 141/78 (!) 154/76  ?Pulse: 77 73 83 85  ?Resp: 18 18 20 20   ?Temp: 98.3 ?F (36.8 ?C) 98.4 ?F (36.9 ?C) 98.4 ?F (36.9 ?C) (!) 97.5 ?F (36.4 ?C)  ?TempSrc: Oral Oral Oral Oral  ?SpO2: 97% 96% 95% 94%  ?Weight:      ?Height:      ? ? ?General: Pt is alert, awake, not in acute distress ?Cardiovascular: RRR, S1/S2 +, no rubs, no gallops ?Respiratory: CTA bilaterally, no wheezing, no rhonchi ?Abdominal: Soft, NT, ND, bowel sounds + ?Extremities: no edema, no cyanosis ? ? ? ?The  results of significant diagnostics from this hospitalization (including imaging, microbiology, ancillary and laboratory) are listed below for reference.   ? ? ?Microbiology: ?Recent Results (from the past 240 hour(s))  ?MRSA Next Gen by PCR, Nasal     Status: None  ? Collection Time: 08/13/21  4:58 PM  ?  Specimen: Nasal Mucosa; Nasal Swab  ?Result Value Ref Range Status  ? MRSA by PCR Next Gen NOT DETECTED NOT DETECTED Final  ?  Comment: (NOTE) ?The GeneXpert MRSA Assay (FDA approved for NASAL specimens only), ?is one component

## 2021-08-16 NOTE — Evaluation (Signed)
Physical Therapy Evaluation ?Patient Details ?Name: Brittany Hanson ?MRN: [id number] ?DOB: 06-Nov-1931 ?Today's Date: 08/16/2021 ? ?History of Present Illness ? Brittany Hanson is a 88 y.o. female with medical history significant of with history of atrial fibrillation, dementia, hypertension, presents the ED with a chief complaint of blood per rectum.  Patient lives with daughter and with patient's dementia is hard to keep track of how often she is having bowel movements.  Daughter reports that she cut patient's MiraLAX way back because she was having bowel incontinence with her depends.  The problem is, patient can't remember if she has had a bowel movement or not each day, so daughter reports she does not know when to give MiraLAX.  She has been getting it about once a week.  Daughter noticed that patient had been in the bathroom for 4 or 5 hours.  She went into check on her and patient said she felt like she needed to evacuate her bowels, but could not.  She then told daughter that she had been up the whole night with the same problem.  Daughter checked the toilet and saw blood.  Also there was blood on the toilet paper that patient was putting into the trash can.  Daughter decided to bring her in.  Patient cannot remember having any abdominal pain, nausea, vomiting.  Daughter says the patient has not complained of abdominal pain or nausea.  She has not seen patient vomit.  They both agree that patient has had normal appetite.  Patient does have a history of hemorrhoids, but has not complained of hemorrhoids hurting.  Possibly internal hemorrhoids?  Patient has no other complaints at this time. ? ?  ?Clinical Impression ? Patient limited for functional mobility as stated below secondary to BLE weakness, fatigue and poor standing balance. Patient seated in chair with family present. Patient requires min G/A with transfer to standing with RW secondary to generalized weakness. She is able to ambulate in room with RW  with slow, labored cadence and is limited by fatigue. Patient's family educated on frequent ambulation with patient while in hospital and at home to improve mobility. Patient discharged to care of nursing for ambulation daily as tolerated for length of stay. ? ?   ? ?Recommendations for follow up therapy are one component of a multi-disciplinary discharge planning process, led by the attending physician.  Recommendations may be updated based on patient status, additional functional criteria and insurance authorization. ? ?Follow Up Recommendations Home health PT ? ?  ?Assistance Recommended at Discharge Intermittent Supervision/Assistance  ?Patient can return home with the following ? A little help with walking and/or transfers;A little help with bathing/dressing/bathroom;Assistance with cooking/housework;Assist for transportation;Help with stairs or ramp for entrance ? ?  ?Equipment Recommendations None recommended by PT  ?Recommendations for Other Services ?    ?  ?Functional Status Assessment Patient has had a recent decline in their functional status and demonstrates the ability to make significant improvements in function in a reasonable and predictable amount of time.  ? ?  ?Precautions / Restrictions Precautions ?Precautions: Fall ?Restrictions ?Weight Bearing Restrictions: No  ? ?  ? ?Mobility ? Bed Mobility ?  ?  ?  ?  ?  ?  ?  ?General bed mobility comments: seated in chair at beginning of session ?  ? ?Transfers ?Overall transfer level: Needs assistance ?Equipment used: Rolling walker (2 wheels) ?Transfers: Sit to/from Stand ?Sit to Stand: Min guard, Min assist ?  ?  ?  ?  ?  ?  General transfer comment: slow, labored ?  ? ?Ambulation/Gait ?Ambulation/Gait assistance: Min guard ?Gait Distance (Feet): 15 Feet ?Assistive device: Rolling walker (2 wheels) ?Gait Pattern/deviations: Trunk flexed, Decreased stride length ?Gait velocity: decreased ?  ?  ?General Gait Details: slow labored cadence with decreased  foot clearance ? ?Stairs ?  ?  ?  ?  ?  ? ?Wheelchair Mobility ?  ? ?Modified Rankin (Stroke Patients Only) ?  ? ?  ? ?Balance   ?  ?  ?  ?  ?  ?  ?  ?  ?  ?  ?  ?  ?  ?  ?  ?  ?  ?  ?   ? ? ? ?Pertinent Vitals/Pain Pain Assessment ?Pain Assessment: No/denies pain  ? ? ?Home Living Family/patient expects to be discharged to:: Private residence ?Living Arrangements: Children ?Available Help at Discharge: Family;Available 24 hours/day ?Type of Home: House ?Home Access: Ramped entrance ?  ?  ?  ?  ?Home Equipment: Wheelchair - manual;Grab bars - toilet;Rolling Environmental consultant (2 wheels);BSC/3in1 ?   ?  ?Prior Function Prior Level of Function : Needs assist ?  ?  ?  ?  ?  ?  ?Mobility Comments: houshold ambulation with RW ?ADLs Comments: family assists ?  ? ? ?Hand Dominance  ?   ? ?  ?Extremity/Trunk Assessment  ? Upper Extremity Assessment ?Upper Extremity Assessment: Defer to OT evaluation ?  ? ?Lower Extremity Assessment ?Lower Extremity Assessment: Generalized weakness ?  ? ?Cervical / Trunk Assessment ?Cervical / Trunk Assessment: Kyphotic  ?Communication  ? Communication: HOH  ?Cognition Arousal/Alertness: Awake/alert ?Behavior During Therapy: [facility] for tasks assessed/performed ?Overall Cognitive Status: Within Functional Limits for tasks assessed ?  ?  ?  ?  ?  ?  ?  ?  ?  ?  ?  ?  ?  ?  ?  ?  ?  ?  ?  ? ?  ?General Comments   ? ?  ?Exercises    ? ?Assessment/Plan  ?  ?PT Assessment All further PT needs can be met in the next venue of care  ?PT Problem List Decreased strength;Decreased mobility;Decreased activity tolerance;Decreased balance ? ?   ?  ?PT Treatment Interventions     ? ?PT Goals (Current goals can be found in the Care Plan section)  ?Acute Rehab PT Goals ?Patient Stated Goal: return home ?PT Goal Formulation: With patient ?Time For Goal Achievement: 08/16/21 ?Potential to Achieve Goals: Good ? ?  ?Frequency   ?  ? ? ?Co-evaluation   ?  ?  ?  ?  ? ? ?  ?AM-PAC PT "6 Clicks" Mobility  ?Outcome Measure Help  needed turning from your back to your side while in a flat bed without using bedrails?: A Little ?Help needed moving from lying on your back to sitting on the side of a flat bed without using bedrails?: A Little ?Help needed moving to and from a bed to a chair (including a wheelchair)?: A Little ?Help needed standing up from a chair using your arms (e.g., wheelchair or bedside chair)?: A Little ?Help needed to walk in hospital room?: A Little ?Help needed climbing 3-5 steps with a railing? : A Lot ?6 Click Score: 17 ? ?  ?End of Session Equipment Utilized During Treatment: Gait belt ?Activity Tolerance: Patient tolerated treatment well;Patient limited by fatigue ?Patient left: in chair;with call bell/phone within reach;with family/visitor present ?Nurse Communication: Mobility status ?PT Visit Diagnosis: Unsteadiness on feet (R26.81);Other  abnormalities of gait and mobility (R26.89);Muscle weakness (generalized) (M62.81) ?  ? ?Time: 3474-2595 ?PT Time Calculation (min) (ACUTE ONLY): 19 min ? ? ?Charges:   PT Evaluation ?$PT Eval Low Complexity: 1 Low ?PT Treatments ?$Therapeutic Activity: 8-22 mins ?  ?   ? ? ?1:30 PM, 08/16/21 ?Mearl Latin PT, DPT ?Physical Therapist at [facility] ?[facility] ? ? ?

## 2021-08-16 NOTE — Telephone Encounter (Signed)
Please arrange hospital follow-up visit with app or myself in 4 to 6 weeks.  Thank you ?

## 2021-08-16 NOTE — Care Management Important Message (Signed)
Important Message ? ?Patient Details  ?Name: [name] ?MRN: [id number] ?Date of Birth: 06-30-1931 ? ? ?Medicare Important Message Given:  Yes ? ?Reviewed  Medicare IM with patient's granddaughter, Jana Half, via room phone [phone]).  Copy of Medicare IM placed in mail to home address on file. ? ? ?Dannette Barbara ?08/16/2021, 10:31 AM ?

## 2021-08-16 NOTE — TOC Transition Note (Signed)
Transition of Care (TOC) - CM/SW Discharge Note ? ? ?Patient Details  ?Name: Brittany Hanson ?MRN: [id number] ?Date of Birth: August 25, 1931 ? ?Transition of Care (TOC) CM/SW Contact:  ?Villa Herb, LCSWA ?Phone Number: ?08/16/2021, 11:22 AM ? ? ?Clinical Narrative:    ?CSW spoke with pts daughter to see if there was a change in mind about HH services at D/C. Pts daughter states she feels [facility] services would be beneficial for pt at D/C. CSW spoke to Maralyn Sago with [facility] who states referral will be sent for review and will likely be accepted. CSW requested that MD place [facility] orders for PT/OT/RN services. TOC signing off.  ? ?Final next level of care: Home w Home Health Services ?Barriers to Discharge: No Barriers Identified ? ? ?Patient Goals and CMS Choice ?Patient states their goals for this hospitalization and ongoing recovery are:: Home with HH ?CMS Medicare.gov Compare Post Acute Care list provided to:: Patient Represenative (must comment) ?Choice offered to / list presented to : [facility] POA / Guardian, Adult Children ? ?Discharge Placement ?  ?           ?  ?  ?  ?  ? ?Discharge Plan and Services ?  ?  ?           ?  ?  ?  ?  ?  ?HH Arranged: RN, PT, OT ?HH Agency: [facility] ?Date HH Agency Contacted: 08/16/21 ?  ?Representative spoke with at [facility] Agency: Maralyn Sago ? ?Social Determinants of Health (SDOH) Interventions ?  ? ? ?Readmission Risk Interventions ?   ? View : No data to display.  ?  ?  ?  ? ? ? ? ? ?

## 2021-08-17 DIAGNOSIS — K922 Gastrointestinal hemorrhage, unspecified: Secondary | ICD-10-CM | POA: Diagnosis not present

## 2021-08-17 DIAGNOSIS — Z7901 Long term (current) use of anticoagulants: Secondary | ICD-10-CM | POA: Diagnosis not present

## 2021-08-17 DIAGNOSIS — R69 Illness, unspecified: Secondary | ICD-10-CM | POA: Diagnosis not present

## 2021-08-17 DIAGNOSIS — I10 Essential (primary) hypertension: Secondary | ICD-10-CM | POA: Diagnosis not present

## 2021-08-17 DIAGNOSIS — M170 Bilateral primary osteoarthritis of knee: Secondary | ICD-10-CM | POA: Diagnosis not present

## 2021-08-17 DIAGNOSIS — I4891 Unspecified atrial fibrillation: Secondary | ICD-10-CM | POA: Diagnosis not present

## 2021-08-17 DIAGNOSIS — D509 Iron deficiency anemia, unspecified: Secondary | ICD-10-CM | POA: Diagnosis not present

## 2021-08-17 DIAGNOSIS — K5909 Other constipation: Secondary | ICD-10-CM | POA: Diagnosis not present

## 2021-08-19 ENCOUNTER — Telehealth: Payer: Self-pay | Admitting: Internal Medicine

## 2021-08-19 ENCOUNTER — Telehealth: Payer: Self-pay

## 2021-08-19 ENCOUNTER — Encounter: Payer: Self-pay | Admitting: Internal Medicine

## 2021-08-19 ENCOUNTER — Encounter (HOSPITAL_COMMUNITY): Payer: Self-pay | Admitting: Internal Medicine

## 2021-08-19 ENCOUNTER — Telehealth: Payer: Self-pay | Admitting: *Deleted

## 2021-08-19 NOTE — Telephone Encounter (Signed)
Revonda Standard called from [facility] asking if Dr Allena Katz will sign home health orders. Allison call back # [phone]. ?

## 2021-08-19 NOTE — Telephone Encounter (Signed)
Advised patel would sign home health orders to fax over ?

## 2021-08-19 NOTE — Telephone Encounter (Signed)
Brittany Hanson with [facility] health called in on patient behalf. ? ?Patient was hospitalized from 4/2-4/7 for rectal bleed, GI bleed, and internal Hemorid. ? ?Patient now has home health orders for nursing OT and PT   ? ?Wants to know if provider will sign off on orders for patient. ? ?Call back  ?Brittany Hanson ?[phone] ?

## 2021-08-19 NOTE — Telephone Encounter (Signed)
Transition Care Management Follow-up Telephone Call ?Date of discharge and from where: 08-16-21 Brittany Hanson  ?How have you been since you were released from the hospital? Doing good had a bm  ?Any questions or concerns? No ? ?Items Reviewed: ?Did the pt receive and understand the discharge instructions provided? Yes  ?Medications obtained and verified? Yes  ?Other? No  ?Any new allergies since your discharge? No  ?Dietary orders reviewed? Yes ?Do you have support at home? Yes  ? ?Home Care and Equipment/Supplies: ?Were home health services ordered? no ?If so, what is the name of the agency? NA  ?Has the agency set up a time to come to the patient's home? not applicable ?Were any new equipment or medical supplies ordered?  No ?What is the name of the medical supply agency? NA ?Were you able to get the supplies/equipment? not applicable ?Do you have any questions related to the use of the equipment or supplies? No ? ?Functional Questionnaire: (I = Independent and D = Dependent) ?ADLs: i ? ?Bathing/Dressing- i ? ?Meal Prep- i ? ?Eating- i ? ?Maintaining continence- i ? ?Transferring/Ambulation- i ? ?Managing Meds- i ? ?Follow up appointments reviewed: ? ?PCP Hospital f/u appt confirmed? Yes  Scheduled  ?Specialist Hospital f/u appt confirmed? No  Scheduled  ?If their condition worsens, is the pt aware to call PCP or go to the Emergency Dept.? Yes ?Was the patient provided with contact information for the PCP's office or ED? Yes ?Was to pt encouraged to call back with questions or concerns? Yes ? ?

## 2021-08-20 ENCOUNTER — Encounter (HOSPITAL_COMMUNITY): Payer: Self-pay | Admitting: Internal Medicine

## 2021-08-20 NOTE — Telephone Encounter (Signed)
LVM for Brittany Hanson to call the office  ?

## 2021-08-20 NOTE — Telephone Encounter (Signed)
Verbal orders given to [facility]  ?

## 2021-08-21 ENCOUNTER — Telehealth: Payer: Self-pay

## 2021-08-21 ENCOUNTER — Other Ambulatory Visit: Payer: Self-pay | Admitting: *Deleted

## 2021-08-21 MED ORDER — LOSARTAN POTASSIUM 100 MG PO TABS: 100.0000 mg | ORAL_TABLET | Freq: Every day | ORAL | 0 refills | Status: DC

## 2021-08-21 NOTE — Telephone Encounter (Signed)
Medication sent to pharmacy  

## 2021-08-21 NOTE — Telephone Encounter (Signed)
Pt needs refill on losartan (COZAAR) 100 MG tablet ?Called to [facility] ?

## 2021-08-22 ENCOUNTER — Other Ambulatory Visit: Payer: Self-pay | Admitting: Internal Medicine

## 2021-08-26 ENCOUNTER — Ambulatory Visit (INDEPENDENT_AMBULATORY_CARE_PROVIDER_SITE_OTHER): Payer: Medicare HMO | Admitting: Family Medicine

## 2021-08-26 ENCOUNTER — Encounter: Payer: Self-pay | Admitting: Family Medicine

## 2021-08-26 VITALS — BP 131/88 | HR 77 | Ht 65.0 in | Wt 183.0 lb

## 2021-08-26 DIAGNOSIS — G309 Alzheimer's disease, unspecified: Secondary | ICD-10-CM | POA: Diagnosis not present

## 2021-08-26 DIAGNOSIS — I4821 Permanent atrial fibrillation: Secondary | ICD-10-CM

## 2021-08-26 DIAGNOSIS — K922 Gastrointestinal hemorrhage, unspecified: Secondary | ICD-10-CM

## 2021-08-26 DIAGNOSIS — M170 Bilateral primary osteoarthritis of knee: Secondary | ICD-10-CM

## 2021-08-26 DIAGNOSIS — R69 Illness, unspecified: Secondary | ICD-10-CM | POA: Diagnosis not present

## 2021-08-26 DIAGNOSIS — K5909 Other constipation: Secondary | ICD-10-CM | POA: Diagnosis not present

## 2021-08-26 DIAGNOSIS — M7989 Other specified soft tissue disorders: Secondary | ICD-10-CM

## 2021-08-26 DIAGNOSIS — D509 Iron deficiency anemia, unspecified: Secondary | ICD-10-CM | POA: Diagnosis not present

## 2021-08-26 DIAGNOSIS — I10 Essential (primary) hypertension: Secondary | ICD-10-CM | POA: Diagnosis not present

## 2021-08-26 DIAGNOSIS — F02B0 Dementia in other diseases classified elsewhere, moderate, without behavioral disturbance, psychotic disturbance, mood disturbance, and anxiety: Secondary | ICD-10-CM

## 2021-08-26 NOTE — Progress Notes (Addendum)
? ?Established Patient Office Visit ? ?Subjective:  ?Patient ID: Brittany Hanson, female    DOB: [birth year]  Age: 88 y.o. MRN: [id number] ? ?CC:  ?Chief Complaint  ?Patient presents with  ? Follow-up  ?  Hospital follow up, discharged on 08/16/2021. Was seen at ED due to bleeding from rectal area, has no bleeding at this time, daughter is with her and has been keeping a watch on this.   ? ? ?HPI ?Brittany Hanson is an 88 y.o. female with PMH of atrial fibrillation, dementia, hypertension, and chronic constipation present for Hospital follow-up. The patient was seen in the ED for lower Gi bleeding due to internal hemorrhoids. She was started on Protonix twice daily. While in the hospital, the patient's hemoglobin dropped from 9.4 to 8.2 and 7.5, and she received 1 unit of PRBC. ? ?At today's visit, the patient reported doing well since being discharged. No hematochezia reproted. The patient denies fatigue, skin pallor, SOB, lightheadedness, dizziness, or palpitations. The patient's daughter states that she is holding off on the Anusol suppository and is giving the Miralax every other day to prevent the loose, watery stools.  ? ?The daughter states that she has noticed an increased swelling of the patient's feet since returning from the beach. She says the drive to the beach was four hours, and her mom's leg was dependent. She also reports that the patient engages in minimal physical activities. ? ?Past Medical History:  ?Diagnosis Date  ? Atrial fibrillation ([place])   ? Dementia ([place])   ? Hypertension   ? Osteoarthritis   ? ? ?Past Surgical History:  ?Procedure Laterality Date  ? COLONOSCOPY WITH PROPOFOL N/A 08/14/2021  ? Procedure: COLONOSCOPY WITH PROPOFOL;  Surgeon: Rogene [place], MD;  Location: [facility];  Service: Endoscopy;  Laterality: N/A;  ? ESOPHAGOGASTRODUODENOSCOPY (EGD) WITH PROPOFOL N/A 08/14/2021  ? Procedure: ESOPHAGOGASTRODUODENOSCOPY (EGD) WITH PROPOFOL;  Surgeon: Rogene [place], MD;  Location:  [facility] END[facility];  Service: Endoscopy;  Laterality: N/A;  ? GIVENS CAPSULE STUDY N/A 08/15/2021  ? Procedure: GIVENS CAPSULE STUDY;  Surgeon: Daneil Dolin, MD;  Location: [facility];  Service: Endoscopy;  Laterality: N/A;  ? ? ?Family History  ?Problem Relation Age of Onset  ? Diabetes Sister   ? Cancer Daughter   ? ? ?Social History  ? ?Socioeconomic History  ? Marital status: Widowed  ?  Spouse name: Not on file  ? Number of children: 3  ? Years of education: 6th  ? Highest education level: Not on file  ?Occupational History  ? Not on file  ?Tobacco Use  ? Smoking status: Never  ? Smokeless tobacco: Never  ?Vaping Use  ? Vaping Use: Never used  ?Substance and Sexual Activity  ? Alcohol use: Never  ? Drug use: Never  ? Sexual activity: Not Currently  ?Other Topics Concern  ? Not on file  ?Social History Narrative  ? Not on file  ? ?Social Determinants of Health  ? ?Financial Resource Strain: Not on file  ?Food Insecurity: Not on file  ?Transportation Needs: Not on file  ?Physical Activity: Not on file  ?Stress: Not on file  ?Social Connections: Not on file  ?Intimate Partner Violence: Not on file  ? ? ?Outpatient Medications Prior to Visit  ?Medication Sig Dispense Refill  ? Acetaminophen (TYLENOL ARTHRITIS PAIN PO) Take 2 tablets by mouth in the morning.    ? donepezil (ARICEPT) 10 MG tablet TAKE 1 TABLET BY MOUTH EVERYDAY AT  BEDTIME 90 tablet 0  ? ELIQUIS 5 MG TABS tablet Take 1 tablet (5 mg total) by mouth 2 (two) times daily. 60 tablet 0  ? hydrochlorothiazide (HYDRODIURIL) 25 MG tablet TAKE 1 TABLET (25 MG TOTAL) BY MOUTH DAILY. 90 tablet 1  ? hydrocortisone (ANUSOL-HC) 25 MG suppository Place 1 suppository (25 mg total) rectally 2 (two) times daily. 12 suppository 0  ? latanoprost (XALATAN) 0.005 % ophthalmic solution Place 1 drop into both eyes at bedtime.    ? losartan (COZAAR) 100 MG tablet Take 1 tablet (100 mg total) by mouth daily. 90 tablet 0  ? memantine (NAMENDA) 5 MG tablet Take 5 mg by mouth 2  (two) times daily.    ? metoprolol succinate (TOPROL-XL) 50 MG 24 hr tablet TAKE 1 TABLET BY MOUTH EVERY DAY 90 tablet 0  ? polyethylene glycol (MIRALAX / GLYCOLAX) 17 g packet Take 17 g by mouth 2 (two) times daily. 14 each 0  ? simvastatin (ZOCOR) 20 MG tablet TAKE 1 TABLET BY MOUTH EVERYDAY AT BEDTIME (Patient taking differently: Take 20 mg by mouth daily at 6 PM.) 90 tablet 0  ? ?No facility-administered medications prior to visit.  ? ? ?No Known Allergies ? ?ROS ?Review of Systems  ?Constitutional:  Negative for chills, fatigue and fever.  ?HENT:  Positive for rhinorrhea. Negative for congestion, sinus pressure, sinus pain and sore throat.   ?Eyes:  Negative for pain, redness and itching.  ?Respiratory:  Negative for cough, chest tightness and shortness of breath.   ?Cardiovascular:  Negative for chest pain and palpitations.  ?Gastrointestinal:  Negative for constipation, diarrhea, nausea and vomiting.  ?Endocrine: Negative for polydipsia, polyphagia and polyuria.  ?Genitourinary:  Negative for frequency, hematuria and urgency.  ?Musculoskeletal:  Negative for back pain and neck pain.  ?Skin:  Negative for pallor.  ?Neurological:  Negative for dizziness, weakness and headaches.  ?Psychiatric/Behavioral:  Positive for behavioral problems and confusion. Negative for suicidal ideas.   ? ?  ?Objective:  ?  ?Physical Exam ?Constitutional:   ?   Appearance: Normal appearance.  ?HENT:  ?   Head: Normocephalic.  ?   Right Ear: External ear normal.  ?   Left Ear: External ear normal.  ?   Nose: No congestion or rhinorrhea.  ?   Mouth/Throat:  ?   Mouth: Mucous membranes are moist.  ?Cardiovascular:  ?   Rate and Rhythm: Normal rate and regular rhythm.  ?   Pulses: Normal pulses.  ?   Heart sounds: Normal heart sounds.  ?Pulmonary:  ?   Effort: Pulmonary effort is normal.  ?   Breath sounds: Normal breath sounds.  ?Genitourinary: ?   Comments: deferred ?Musculoskeletal:  ?   Cervical back: Normal range of motion. No  tenderness.  ?   Right lower leg: No deformity or bony tenderness. 2+ Edema present.  ?   Left lower leg: No deformity or bony tenderness. 2+ Edema present.  ?   Right foot: Swelling present.  ?   Left foot: Swelling present.  ?   Comments: Blister on the dorsal part of the right foot  ?Skin: ?   General: Skin is warm.  ?   Coloration: Skin is not pale.  ?   Findings: No bruising or erythema.  ?   Comments: Skin blister on the dorsal part of the right foot  ?Neurological:  ?   Mental Status: She is alert.  ?   Comments: Hx of dementia  ?Psychiatric:  ?  Comments: Normal affect  ? ? ?BP 131/88 (BP Location: Right Arm, Patient Position: Sitting)   Pulse 77   Ht $R'5\' 5"'Vg$  (1.651 m)   Wt 183 lb (83 kg)   SpO2 97%   BMI 30.45 kg/m?  ?Wt Readings from Last 3 Encounters:  ?08/26/21 183 lb (83 kg)  ?08/12/21 162 lb 4.1 oz (73.6 kg)  ?02/21/21 177 lb 1.9 oz (80.3 kg)  ? ? ?No results found for: TSH ?Lab Results  ?Component Value Date  ? WBC 6.3 08/26/2021  ? HGB 9.5 (L) 08/26/2021  ? HCT 32.2 (L) 08/26/2021  ? MCV 83 08/26/2021  ? PLT 245 08/26/2021  ? ?Lab Results  ?Component Value Date  ? NA 143 08/26/2021  ? K 4.7 08/26/2021  ? CO2 26 08/26/2021  ? GLUCOSE 112 (H) 08/26/2021  ? BUN 16 08/26/2021  ? CREATININE 1.02 (H) 08/26/2021  ? BILITOT 0.4 08/26/2021  ? ALKPHOS 90 08/26/2021  ? AST 19 08/26/2021  ? ALT 10 08/26/2021  ? PROT 6.1 08/26/2021  ? ALBUMIN 4.1 08/26/2021  ? CALCIUM 10.1 08/26/2021  ? ANIONGAP 7 08/13/2021  ? EGFR 53 (L) 08/26/2021  ? ?No results found for: CHOL ?No results found for: HDL ?No results found for: [place] ?No results found for: TRIG ?No results found for: CHOLHDL ?No results found for: HGBA1C ? ?  ?Assessment & Plan:  ? ?Problem List Items Addressed This Visit   ? ?  ? Cardiovascular and Mediastinum  ? Atrial fibrillation ([place])  ?  -Patient has resumed Eliquis ? ?  ?  ? Essential hypertension  ?  -Continue antihypertensive treatments ? ?  ?  ?  ? Digestive  ? Chronic constipation  ?  -The  patient is taking Miralax every other day  ?-Pt is not taking the Anusol suppository at this time ? ?  ?  ? GI bleed  ?  -No hematochezia reported since discharged from the hospital ?-following-up with GI ?-labs and

## 2021-08-26 NOTE — Patient Instructions (Signed)
I appreciate the opportunity to provide care to you today! ?  ? ?Labs: CBC and CMP ? ?-No refills on medications today ? ?-Elevate your leg and help with the swelling ? ?-You can apply OTC hydrocolloid dressing  once the blister opens ? ?-Please try to stay active with PT tomorrow ? ? ?  ?It was a pleasure to see you and I look forward to continuing to work together on your health and well-being. ?Please do not hesitate to call the office if you need care or have questions about your care. ?  ?Have a wonderful day and week. ?With Gratitude, ?Gilmore [facility]oche MSN, FNP-BC  ?

## 2021-08-27 DIAGNOSIS — K5909 Other constipation: Secondary | ICD-10-CM | POA: Diagnosis not present

## 2021-08-27 DIAGNOSIS — Z7901 Long term (current) use of anticoagulants: Secondary | ICD-10-CM | POA: Diagnosis not present

## 2021-08-27 DIAGNOSIS — K922 Gastrointestinal hemorrhage, unspecified: Secondary | ICD-10-CM | POA: Diagnosis not present

## 2021-08-27 DIAGNOSIS — M170 Bilateral primary osteoarthritis of knee: Secondary | ICD-10-CM | POA: Diagnosis not present

## 2021-08-27 DIAGNOSIS — I10 Essential (primary) hypertension: Secondary | ICD-10-CM | POA: Diagnosis not present

## 2021-08-27 DIAGNOSIS — I4891 Unspecified atrial fibrillation: Secondary | ICD-10-CM | POA: Diagnosis not present

## 2021-08-27 DIAGNOSIS — D509 Iron deficiency anemia, unspecified: Secondary | ICD-10-CM | POA: Diagnosis not present

## 2021-08-27 DIAGNOSIS — M7989 Other specified soft tissue disorders: Secondary | ICD-10-CM | POA: Insufficient documentation

## 2021-08-27 DIAGNOSIS — R69 Illness, unspecified: Secondary | ICD-10-CM | POA: Diagnosis not present

## 2021-08-27 LAB — CBC WITH DIFFERENTIAL/PLATELET
Basophils Absolute: 0.1 10*3/uL (ref 0.0–0.2)
Basos: 1 %
EOS (ABSOLUTE): 0.2 10*3/uL (ref 0.0–0.4)
Eos: 4 %
Hematocrit: 32.2 % — ABNORMAL LOW (ref 34.0–46.6)
Hemoglobin: 9.5 g/dL — ABNORMAL LOW (ref 11.1–15.9)
Immature Grans (Abs): 0 10*3/uL (ref 0.0–0.1)
Immature Granulocytes: 0 %
Lymphocytes Absolute: 1 10*3/uL (ref 0.7–3.1)
Lymphs: 16 %
MCH: 24.5 pg — ABNORMAL LOW (ref 26.6–33.0)
MCHC: 29.5 g/dL — ABNORMAL LOW (ref 31.5–35.7)
MCV: 83 fL (ref 79–97)
Monocytes Absolute: 0.7 10*3/uL (ref 0.1–0.9)
Monocytes: 11 %
Neutrophils Absolute: 4.3 10*3/uL (ref 1.4–7.0)
Neutrophils: 68 %
Platelets: 245 10*3/uL (ref 150–450)
RBC: 3.88 x10E6/uL (ref 3.77–5.28)
RDW: 15.1 % (ref 11.7–15.4)
WBC: 6.3 10*3/uL (ref 3.4–10.8)

## 2021-08-27 LAB — CMP14+EGFR
ALT: 10 IU/L (ref 0–32)
AST: 19 IU/L (ref 0–40)
Albumin/Globulin Ratio: 2.1 (ref 1.2–2.2)
Albumin: 4.1 g/dL (ref 3.6–4.6)
Alkaline Phosphatase: 90 IU/L (ref 44–121)
BUN/Creatinine Ratio: 16 (ref 12–28)
BUN: 16 mg/dL (ref 8–27)
Bilirubin Total: 0.4 mg/dL (ref 0.0–1.2)
CO2: 26 mmol/L (ref 20–29)
Calcium: 10.1 mg/dL (ref 8.7–10.3)
Chloride: 104 mmol/L (ref 96–106)
Creatinine, Ser: 1.02 mg/dL — ABNORMAL HIGH (ref 0.57–1.00)
Globulin, Total: 2 g/dL (ref 1.5–4.5)
Glucose: 112 mg/dL — ABNORMAL HIGH (ref 70–99)
Potassium: 4.7 mmol/L (ref 3.5–5.2)
Sodium: 143 mmol/L (ref 134–144)
Total Protein: 6.1 g/dL (ref 6.0–8.5)
eGFR: 53 mL/min/{1.73_m2} — ABNORMAL LOW (ref >59)

## 2021-08-27 NOTE — Assessment & Plan Note (Addendum)
-  Likely due to dependent leg position from recent travels ?-Patient is on thiazide diuretic ?-Advised to elevate the legs to decrease swelling ?-Advised to call if swelling increases in a few weeks ?

## 2021-08-27 NOTE — Assessment & Plan Note (Signed)
-  Following up with PT starting 08/27/2021 ?

## 2021-08-27 NOTE — Assessment & Plan Note (Signed)
-  Patient has resumed Eliquis ?

## 2021-08-27 NOTE — Assessment & Plan Note (Addendum)
-  No hematochezia reported since discharged from the hospital ?-following-up with GI ?-labs and imaging reviewed ? ? ?

## 2021-08-27 NOTE — Assessment & Plan Note (Addendum)
-  No signs of agitation or behavior disturbance were reported ?-The patient's daughter states that the patient sleeps for most of the day ?- Continue Namenda and  Aricept ? ?

## 2021-08-27 NOTE — Assessment & Plan Note (Addendum)
-  Continue antihypertensive treatments ?

## 2021-08-27 NOTE — Assessment & Plan Note (Signed)
-  The patient is taking Miralax every other day  ?-Pt is not taking the Anusol suppository at this time ?

## 2021-08-27 NOTE — Assessment & Plan Note (Signed)
Pending CBC and CMP results ?

## 2021-08-28 ENCOUNTER — Telehealth: Payer: Self-pay

## 2021-08-28 DIAGNOSIS — Z7901 Long term (current) use of anticoagulants: Secondary | ICD-10-CM | POA: Diagnosis not present

## 2021-08-28 DIAGNOSIS — I4891 Unspecified atrial fibrillation: Secondary | ICD-10-CM | POA: Diagnosis not present

## 2021-08-28 DIAGNOSIS — R69 Illness, unspecified: Secondary | ICD-10-CM | POA: Diagnosis not present

## 2021-08-28 DIAGNOSIS — D509 Iron deficiency anemia, unspecified: Secondary | ICD-10-CM | POA: Diagnosis not present

## 2021-08-28 DIAGNOSIS — I10 Essential (primary) hypertension: Secondary | ICD-10-CM | POA: Diagnosis not present

## 2021-08-28 DIAGNOSIS — M170 Bilateral primary osteoarthritis of knee: Secondary | ICD-10-CM | POA: Diagnosis not present

## 2021-08-28 DIAGNOSIS — K5909 Other constipation: Secondary | ICD-10-CM | POA: Diagnosis not present

## 2021-08-28 DIAGNOSIS — K922 Gastrointestinal hemorrhage, unspecified: Secondary | ICD-10-CM | POA: Diagnosis not present

## 2021-08-28 NOTE — Telephone Encounter (Signed)
Aurther Loft from [facility] called left a voicemail @ 12:01 pm needs a verbal order for patient. Call back # [phone]. ?

## 2021-08-28 NOTE — Telephone Encounter (Signed)
Terry with [facility] wanted to let provider know Patient does not need OT therapy still has PT evaluation in place.  ?

## 2021-08-30 DIAGNOSIS — I10 Essential (primary) hypertension: Secondary | ICD-10-CM | POA: Diagnosis not present

## 2021-08-30 DIAGNOSIS — K5909 Other constipation: Secondary | ICD-10-CM | POA: Diagnosis not present

## 2021-08-30 DIAGNOSIS — R69 Illness, unspecified: Secondary | ICD-10-CM | POA: Diagnosis not present

## 2021-08-30 DIAGNOSIS — D509 Iron deficiency anemia, unspecified: Secondary | ICD-10-CM | POA: Diagnosis not present

## 2021-08-30 DIAGNOSIS — I4891 Unspecified atrial fibrillation: Secondary | ICD-10-CM | POA: Diagnosis not present

## 2021-08-30 DIAGNOSIS — K922 Gastrointestinal hemorrhage, unspecified: Secondary | ICD-10-CM | POA: Diagnosis not present

## 2021-08-30 DIAGNOSIS — Z7901 Long term (current) use of anticoagulants: Secondary | ICD-10-CM | POA: Diagnosis not present

## 2021-08-30 DIAGNOSIS — M170 Bilateral primary osteoarthritis of knee: Secondary | ICD-10-CM | POA: Diagnosis not present

## 2021-09-03 DIAGNOSIS — K922 Gastrointestinal hemorrhage, unspecified: Secondary | ICD-10-CM | POA: Diagnosis not present

## 2021-09-03 DIAGNOSIS — I4891 Unspecified atrial fibrillation: Secondary | ICD-10-CM | POA: Diagnosis not present

## 2021-09-03 DIAGNOSIS — Z7901 Long term (current) use of anticoagulants: Secondary | ICD-10-CM | POA: Diagnosis not present

## 2021-09-03 DIAGNOSIS — D509 Iron deficiency anemia, unspecified: Secondary | ICD-10-CM | POA: Diagnosis not present

## 2021-09-03 DIAGNOSIS — R69 Illness, unspecified: Secondary | ICD-10-CM | POA: Diagnosis not present

## 2021-09-03 DIAGNOSIS — M170 Bilateral primary osteoarthritis of knee: Secondary | ICD-10-CM | POA: Diagnosis not present

## 2021-09-03 DIAGNOSIS — K5909 Other constipation: Secondary | ICD-10-CM | POA: Diagnosis not present

## 2021-09-03 DIAGNOSIS — I10 Essential (primary) hypertension: Secondary | ICD-10-CM | POA: Diagnosis not present

## 2021-09-05 DIAGNOSIS — K922 Gastrointestinal hemorrhage, unspecified: Secondary | ICD-10-CM | POA: Diagnosis not present

## 2021-09-05 DIAGNOSIS — D509 Iron deficiency anemia, unspecified: Secondary | ICD-10-CM | POA: Diagnosis not present

## 2021-09-05 DIAGNOSIS — Z7901 Long term (current) use of anticoagulants: Secondary | ICD-10-CM | POA: Diagnosis not present

## 2021-09-05 DIAGNOSIS — I4891 Unspecified atrial fibrillation: Secondary | ICD-10-CM | POA: Diagnosis not present

## 2021-09-05 DIAGNOSIS — K5909 Other constipation: Secondary | ICD-10-CM | POA: Diagnosis not present

## 2021-09-05 DIAGNOSIS — I10 Essential (primary) hypertension: Secondary | ICD-10-CM | POA: Diagnosis not present

## 2021-09-05 DIAGNOSIS — M170 Bilateral primary osteoarthritis of knee: Secondary | ICD-10-CM | POA: Diagnosis not present

## 2021-09-05 DIAGNOSIS — R69 Illness, unspecified: Secondary | ICD-10-CM | POA: Diagnosis not present

## 2021-09-06 DIAGNOSIS — I10 Essential (primary) hypertension: Secondary | ICD-10-CM | POA: Diagnosis not present

## 2021-09-06 DIAGNOSIS — R69 Illness, unspecified: Secondary | ICD-10-CM | POA: Diagnosis not present

## 2021-09-06 DIAGNOSIS — K922 Gastrointestinal hemorrhage, unspecified: Secondary | ICD-10-CM | POA: Diagnosis not present

## 2021-09-06 DIAGNOSIS — K5909 Other constipation: Secondary | ICD-10-CM | POA: Diagnosis not present

## 2021-09-06 DIAGNOSIS — D509 Iron deficiency anemia, unspecified: Secondary | ICD-10-CM | POA: Diagnosis not present

## 2021-09-06 DIAGNOSIS — Z7901 Long term (current) use of anticoagulants: Secondary | ICD-10-CM | POA: Diagnosis not present

## 2021-09-06 DIAGNOSIS — M170 Bilateral primary osteoarthritis of knee: Secondary | ICD-10-CM | POA: Diagnosis not present

## 2021-09-06 DIAGNOSIS — I4891 Unspecified atrial fibrillation: Secondary | ICD-10-CM | POA: Diagnosis not present

## 2021-09-10 DIAGNOSIS — R69 Illness, unspecified: Secondary | ICD-10-CM | POA: Diagnosis not present

## 2021-09-10 DIAGNOSIS — D509 Iron deficiency anemia, unspecified: Secondary | ICD-10-CM | POA: Diagnosis not present

## 2021-09-10 DIAGNOSIS — K922 Gastrointestinal hemorrhage, unspecified: Secondary | ICD-10-CM | POA: Diagnosis not present

## 2021-09-10 DIAGNOSIS — I10 Essential (primary) hypertension: Secondary | ICD-10-CM | POA: Diagnosis not present

## 2021-09-10 DIAGNOSIS — K5909 Other constipation: Secondary | ICD-10-CM | POA: Diagnosis not present

## 2021-09-10 DIAGNOSIS — M170 Bilateral primary osteoarthritis of knee: Secondary | ICD-10-CM | POA: Diagnosis not present

## 2021-09-10 DIAGNOSIS — I4891 Unspecified atrial fibrillation: Secondary | ICD-10-CM | POA: Diagnosis not present

## 2021-09-10 DIAGNOSIS — Z7901 Long term (current) use of anticoagulants: Secondary | ICD-10-CM | POA: Diagnosis not present

## 2021-09-12 ENCOUNTER — Telehealth: Payer: Self-pay | Admitting: Internal Medicine

## 2021-09-12 DIAGNOSIS — I739 Peripheral vascular disease, unspecified: Secondary | ICD-10-CM | POA: Diagnosis not present

## 2021-09-12 DIAGNOSIS — M1990 Unspecified osteoarthritis, unspecified site: Secondary | ICD-10-CM | POA: Diagnosis not present

## 2021-09-12 DIAGNOSIS — H04129 Dry eye syndrome of unspecified lacrimal gland: Secondary | ICD-10-CM | POA: Diagnosis not present

## 2021-09-12 DIAGNOSIS — D6869 Other thrombophilia: Secondary | ICD-10-CM | POA: Diagnosis not present

## 2021-09-12 DIAGNOSIS — R32 Unspecified urinary incontinence: Secondary | ICD-10-CM | POA: Diagnosis not present

## 2021-09-12 DIAGNOSIS — R69 Illness, unspecified: Secondary | ICD-10-CM | POA: Diagnosis not present

## 2021-09-12 DIAGNOSIS — I10 Essential (primary) hypertension: Secondary | ICD-10-CM | POA: Diagnosis not present

## 2021-09-12 DIAGNOSIS — I4891 Unspecified atrial fibrillation: Secondary | ICD-10-CM | POA: Diagnosis not present

## 2021-09-12 DIAGNOSIS — K5909 Other constipation: Secondary | ICD-10-CM | POA: Diagnosis not present

## 2021-09-12 DIAGNOSIS — K5900 Constipation, unspecified: Secondary | ICD-10-CM | POA: Diagnosis not present

## 2021-09-12 DIAGNOSIS — Z008 Encounter for other general examination: Secondary | ICD-10-CM | POA: Diagnosis not present

## 2021-09-12 DIAGNOSIS — D509 Iron deficiency anemia, unspecified: Secondary | ICD-10-CM | POA: Diagnosis not present

## 2021-09-12 DIAGNOSIS — K922 Gastrointestinal hemorrhage, unspecified: Secondary | ICD-10-CM | POA: Diagnosis not present

## 2021-09-12 DIAGNOSIS — M170 Bilateral primary osteoarthritis of knee: Secondary | ICD-10-CM | POA: Diagnosis not present

## 2021-09-12 DIAGNOSIS — H409 Unspecified glaucoma: Secondary | ICD-10-CM | POA: Diagnosis not present

## 2021-09-12 DIAGNOSIS — E785 Hyperlipidemia, unspecified: Secondary | ICD-10-CM | POA: Diagnosis not present

## 2021-09-12 DIAGNOSIS — Z7901 Long term (current) use of anticoagulants: Secondary | ICD-10-CM | POA: Diagnosis not present

## 2021-09-12 NOTE — Telephone Encounter (Signed)
Pt has appt 09-13-21 advised with verbal understanding ?

## 2021-09-12 NOTE — Telephone Encounter (Signed)
Cala Bradford NP with [facility] called in on patient behalf with FYI ? ?Patient had home visit today with Cala Bradford  ?Bp manual 83/60 ?Bp electric 75/55 ?Was also checked multiple times  ? ?HR 64  ?Temp 97.5 ?O2 96 ?Daughter Bonita Quin  states that patient has been sluggish  ? ?Daughter will be spot checking patient bp . can can call daughter back for more info if needed  ? ?Cala Bradford call back info  ?[phone] ?

## 2021-09-13 ENCOUNTER — Encounter: Payer: Self-pay | Admitting: Nurse Practitioner

## 2021-09-13 ENCOUNTER — Ambulatory Visit (INDEPENDENT_AMBULATORY_CARE_PROVIDER_SITE_OTHER): Payer: Medicare HMO | Admitting: Nurse Practitioner

## 2021-09-13 VITALS — BP 102/61 | HR 73 | Ht 65.0 in | Wt 165.0 lb

## 2021-09-13 DIAGNOSIS — I10 Essential (primary) hypertension: Secondary | ICD-10-CM

## 2021-09-13 DIAGNOSIS — R634 Abnormal weight loss: Secondary | ICD-10-CM

## 2021-09-13 DIAGNOSIS — K5909 Other constipation: Secondary | ICD-10-CM

## 2021-09-13 MED ORDER — BISACODYL 5 MG PO TBEC: 10.0000 mg | DELAYED_RELEASE_TABLET | Freq: Every day | ORAL | 0 refills | Status: DC | PRN

## 2021-09-13 NOTE — Progress Notes (Signed)
? ?ENDYA AUSTIN     MRN: [id number]      DOB: 1931/06/20 ? ? ?HPI ?Ms. Wenke with past medical history of A-fib, essential hypertension, chronic constipation, dementia, GI bleed is here for complaint of constipation, low blood pressure and weight loss.  Patient is accompanied to today's visit by her daughter who provided history. ? ?Chronic constipation .  Patient daughter stated that has not moved her bowel in 5 days , she has been eating well, states that patient eats 2 good meals in the morning and lunch but does not eat much in the evenings.  Later stated that patient has been taking MiraLAX twice daily . they denied abdominal pain, nausea, vomiting, bloody stool.  They stated that patient has not been getting up as much she states in her wheelchair most of the time and only gets up to urinate.   ? ?She stated that they noticed a 40 pound weight loss in 4 weeks, patient eats two healthy meals daily eats less at dinnertime, denied fever, chills, malaise. ? ? ?Hypertension.  Patient daughter stated that patient is was found to have low blood pressure when BP was checked at home by home health nurses, also blood pressure was low this morning.  They told her give only metoprolol and to hold order blood pressure medications.  The denies chest pain, syncope, dizziness ? ? ? ? ? ?ROS ?Denies recent fever or chills. ?Denies sinus pressure, nasal congestion, ear pain or sore throat. ?Denies chest congestion, productive cough or wheezing. ?Denies chest pains, palpitations and leg swelling ?Denies abdominal pain, nausea, vomiting,diarrhea or  ?Denies headaches, seizures, numbness, or tingling. ?Denies depression, anxiety or insomnia. ? ? ? ?PE ? ?BP 102/61   Pulse 73   Ht 5\' 5"  (1.651 m)   Wt 165 lb (74.8 kg)   SpO2 95%   BMI 27.46 kg/m?  ? ?Patient alert and oriented and in no cardiopulmonary distress. ? ?HEENT: No facial asymmetry, EOMI,     Neck supple . ? ?Chest: Clear to auscultation bilaterally. ? ?CVS: S1,  S2 no murmurs, no S3.Regular rate. ? ?ABD: Soft non tender, no masses palpated, active bowel sounds in all 4 quadrants  ? ?Ext: No edema, patient sitting in the wheelchair ? ? ?Psych: Good eye contact, normal affect. Memory at baseline not anxious or depressed appearing. ? ? ? ?Assessment & Plan ? ?Essential hypertension ?BP Readings from Last 3 Encounters:  ?09/13/21 102/61  ?08/26/21 131/88  ?08/16/21 122/69  ?Patient has been experiencing hypotension lately ?Continue metoprolol 50 mg daily hold losartan 100 mg tablet, hydrochlorothiazide 25 mg tablet if blood pressure is less than 120/60 ?Continue to monitor blood pressure at home call the office in 1 week with blood pressure readings. ?Patient encouraged to drink of water to stay hydrated.  ? ?Chronic constipation ?Chronic condition on MiraLAX 17 g daily ?Take Dulcolax tablets 10 mg daily as needed for constipation ?Patient encouraged to drink at least 64 ounces of water daily increase intake of fiber. ?Patient encouraged to get up and do some walking exercises at home to stimulate her bowel. ? ?Weight loss ?Wt Readings from Last 3 Encounters:  ?09/13/21 165 lb (74.8 kg)  ?08/26/21 183 lb (83 kg)  ?08/12/21 162 lb 4.1 oz (73.6 kg)  ?not sure if this is due to using different scales or patient was fluid over loaaded at her previous visit.  ?The denied nausea vomiting fever chills. ?Patient has been eating 2 good meals daily at home ?  Patient's daughter told to monitor weight using the same scale at home and notify the office if she notices any change in the patient's weight.  ?

## 2021-09-13 NOTE — Patient Instructions (Signed)
Please take dulcolax 10mg  daily as needed for constipation  ? ?For constipation it is important that you have an adequate intake of fruit and vegetables daily, at least 3 servings of each, as well as water intake of at least 48 ounces daily and regular exercise. ? ?OTC stool softeners are helpful for daily use, up to 4 daily (eg. Colace) ? ?Fiber intake daily is needed, in the form of Bran or Shredded Wheat  ? ? ?Please do not give losartan and hydrochlorothiazide if blood  pressure is less than 110/60. Pleas check blood pressure daily before taking medications, write the numbers and call the office in a week.  ? ? ?It is important that you exercise regularly as tolerated at least 30 minutes 5 times a week.  ?Think about what you will eat, plan ahead. ?Choose " clean, green, fresh or frozen" over canned, processed or packaged foods which are more sugary, salty and fatty. ?70 to 75% of food eaten should be vegetables and fruit. ?Three meals at set times with snacks allowed between meals, but they must be fruit or vegetables. ?Aim to eat over a 12 hour period , example 7 am to 7 pm, and STOP after  your last meal of the day. ?Drink water,generally about 64 ounces per day, no other drink is as healthy. Fruit juice is best enjoyed in a healthy way, by EATING the fruit. ? ?Thanks for choosing [facility], we consider it a privelige to serve you.  ?

## 2021-09-14 ENCOUNTER — Encounter: Payer: Self-pay | Admitting: Nurse Practitioner

## 2021-09-14 DIAGNOSIS — R634 Abnormal weight loss: Secondary | ICD-10-CM | POA: Insufficient documentation

## 2021-09-14 NOTE — Assessment & Plan Note (Addendum)
Chronic condition on MiraLAX 17 g daily ?Take Dulcolax tablets 10 mg daily as needed for constipation ?Patient encouraged to drink at least 64 ounces of water daily increase intake of fiber. ?Patient encouraged to get up and do some walking exercises at home to stimulate her bowel. ?follw up with GI as planned ?

## 2021-09-14 NOTE — Assessment & Plan Note (Signed)
BP Readings from Last 3 Encounters:  ?09/13/21 102/61  ?08/26/21 131/88  ?08/16/21 122/69  ?Patient has been experiencing hypotension lately ?Continue metoprolol 50 mg daily hold losartan 100 mg tablet, hydrochlorothiazide 25 mg tablet if blood pressure is less than 120/60 ?Continue to monitor blood pressure at home call the office in 1 week with blood pressure readings. ?Patient encouraged to drink of water to stay hydrated.  ?

## 2021-09-14 NOTE — Assessment & Plan Note (Signed)
Wt Readings from Last 3 Encounters:  ?09/13/21 165 lb (74.8 kg)  ?08/26/21 183 lb (83 kg)  ?08/12/21 162 lb 4.1 oz (73.6 kg)  ?not sure if this is due to using different scales or patient was fluid over loaaded at her previous visit.  ?The denied nausea vomiting fever chills. ?Patient has been eating 2 good meals daily at home ?Patient's daughter told to monitor weight using the same scale at home and notify the office if she notices any change in the patient's weight. ?

## 2021-09-18 DIAGNOSIS — F0390 Unspecified dementia without behavioral disturbance: Secondary | ICD-10-CM

## 2021-09-18 DIAGNOSIS — Z7901 Long term (current) use of anticoagulants: Secondary | ICD-10-CM

## 2021-09-18 DIAGNOSIS — I10 Essential (primary) hypertension: Secondary | ICD-10-CM

## 2021-09-18 DIAGNOSIS — R69 Illness, unspecified: Secondary | ICD-10-CM | POA: Diagnosis not present

## 2021-09-18 DIAGNOSIS — K5909 Other constipation: Secondary | ICD-10-CM

## 2021-09-18 DIAGNOSIS — M170 Bilateral primary osteoarthritis of knee: Secondary | ICD-10-CM

## 2021-09-18 DIAGNOSIS — K922 Gastrointestinal hemorrhage, unspecified: Secondary | ICD-10-CM

## 2021-09-18 DIAGNOSIS — D509 Iron deficiency anemia, unspecified: Secondary | ICD-10-CM

## 2021-09-18 DIAGNOSIS — I4891 Unspecified atrial fibrillation: Secondary | ICD-10-CM

## 2021-09-23 ENCOUNTER — Telehealth: Payer: Self-pay | Admitting: Nurse Practitioner

## 2021-09-23 NOTE — Telephone Encounter (Signed)
Pt daughter called with bp readings for the past week ? ?5/5 : 86/67  lt ?5/6 : 86/62  lt ?5/7 : 95/74  lt  ?5/8 : 91/66  lt, 101/58  rt,  85/54 lt ?5/9 : 105/68 rt ?5/10 : didn't do  ?5/11 : 101/66 rt ?5/12 : 109/73 rt ?5/15 : 103/70 rt  ? ?Pt daughter states she is still not giving her the 2 other bp pills. Please advise ?

## 2021-09-23 NOTE — Telephone Encounter (Signed)
Did you need these or are these for Dr Allena Katz. Please advise ?

## 2021-09-24 NOTE — Telephone Encounter (Signed)
Called linda got permission from Katisha to discuss. Advised of Fola's message linda verbalized understanding ?

## 2021-10-01 ENCOUNTER — Encounter: Payer: Self-pay | Admitting: Internal Medicine

## 2021-10-01 ENCOUNTER — Ambulatory Visit: Payer: Medicare HMO | Admitting: Gastroenterology

## 2021-10-01 ENCOUNTER — Encounter: Payer: Self-pay | Admitting: Gastroenterology

## 2021-10-01 VITALS — BP 100/50 | HR 111 | Temp 98.0°F

## 2021-10-01 DIAGNOSIS — K922 Gastrointestinal hemorrhage, unspecified: Secondary | ICD-10-CM

## 2021-10-01 DIAGNOSIS — K5900 Constipation, unspecified: Secondary | ICD-10-CM | POA: Diagnosis not present

## 2021-10-01 LAB — CBC WITH DIFFERENTIAL/PLATELET
Absolute Monocytes: 589 cells/uL (ref 200–950)
Basophils Absolute: 58 cells/uL (ref 0–200)
Basophils Relative: 0.9 %
Eosinophils Absolute: 256 cells/uL (ref 15–500)
Eosinophils Relative: 4 %
HCT: 35.1 % (ref 35.0–45.0)
Hemoglobin: 11 g/dL — ABNORMAL LOW (ref 11.7–15.5)
Lymphs Abs: 1235 cells/uL (ref 850–3900)
MCH: 26.3 pg — ABNORMAL LOW (ref 27.0–33.0)
MCHC: 31.3 g/dL — ABNORMAL LOW (ref 32.0–36.0)
MCV: 84 fL (ref 80.0–100.0)
MPV: 11.6 fL (ref 7.5–12.5)
Monocytes Relative: 9.2 %
Neutro Abs: 4262 cells/uL (ref 1500–7800)
Neutrophils Relative %: 66.6 %
Platelets: 226 10*3/uL (ref 140–400)
RBC: 4.18 10*6/uL (ref 3.80–5.10)
RDW: 16 % — ABNORMAL HIGH (ref 11.0–15.0)
Total Lymphocyte: 19.3 %
WBC: 6.4 10*3/uL (ref 3.8–10.8)

## 2021-10-01 NOTE — Progress Notes (Signed)
Gastroenterology Office Note     Primary Care Physician:  Lindell Spar, MD  Primary Gastroenterologist: Dr. Abbey Chatters   Chief Complaint   Chief Complaint  Patient presents with   Hospitalization Follow-up    Constipation---but the pt is fine  now     History of Present Illness   Brittany Hanson is an 88 y.o. female presenting today in follow-up with a history of dementia, afib on eliquis, chronic constipation presenting to the ED on 4/2 due to rectal bleeding. CT A/P without contrast noted large stool ball in the rectum with mild associated wall thickening (possible stercoral colitis).  GI consulted for further evaluation of bleeding and anemia.   Colonoscopy during admission with internal/external hemorrhoids. EGD with 2 cm sliding hiatal hernia otherwise normal. Unable to swallow capsule.   5 mg dulcolax BID. Going every 2-3 days. Can't swallow pills right. No straining. Miralax caused watery stools when taking daily. If taking every other day, got constipated.    Past Medical History:  Diagnosis Date   Atrial fibrillation ([place])    Dementia ([place])    Hypertension    Osteoarthritis     Past Surgical History:  Procedure Laterality Date   COLONOSCOPY WITH PROPOFOL N/A 08/14/2021   internal/external hemorrhoids   ESOPHAGOGASTRODUODENOSCOPY (EGD) WITH PROPOFOL N/A 08/14/2021   2 cm sliding hiatal hernia otherwise normal   GIVENS CAPSULE STUDY N/A 08/15/2021   Procedure: GIVENS CAPSULE STUDY;  Surgeon: Daneil Dolin, MD;  [facility];  Service: Endoscopy;  Laterality: N/A;    Current Outpatient Medications  Medication Sig Dispense Refill   Acetaminophen (TYLENOL ARTHRITIS PAIN PO) Take 2 tablets by mouth in the morning.     bisacodyl 5 MG EC tablet Take 2 tablets (10 mg total) by mouth daily as needed for moderate constipation. 20 tablet 0   donepezil (ARICEPT) 10 MG tablet TAKE 1 TABLET BY MOUTH EVERYDAY AT BEDTIME 90 tablet 0   ELIQUIS 5 MG TABS  tablet Take 1 tablet (5 mg total) by mouth 2 (two) times daily. 60 tablet 0   latanoprost (XALATAN) 0.005 % ophthalmic solution Place 1 drop into both eyes at bedtime.     memantine (NAMENDA) 5 MG tablet Take 5 mg by mouth 2 (two) times daily.     metoprolol succinate (TOPROL-XL) 50 MG 24 hr tablet TAKE 1 TABLET BY MOUTH EVERY DAY 90 tablet 0   simvastatin (ZOCOR) 20 MG tablet TAKE 1 TABLET BY MOUTH EVERYDAY AT BEDTIME (Patient taking differently: Take 20 mg by mouth daily at 6 PM.) 90 tablet 0   No current facility-administered medications for this visit.    Allergies as of 10/01/2021   (No Known Allergies)    Family History  Problem Relation Age of Onset   Diabetes Sister    Cancer Daughter     Social History   Socioeconomic History   Marital status: Widowed    Spouse name: Not on file   Number of children: 3   Years of education: 6th   Highest education level: Not on file  Occupational History   Not on file  Tobacco Use   Smoking status: Never   Smokeless tobacco: Never  Vaping Use   Vaping Use: Never used  Substance and Sexual Activity   Alcohol use: Never   Drug use: Never   Sexual activity: Not Currently  Other Topics Concern   Not on file  Social History Narrative   Not on  file   Social Determinants of Health   Financial Resource Strain: Not on file  Food Insecurity: Not on file  Transportation Needs: Not on file  Physical Activity: Not on file  Stress: Not on file  Social Connections: Not on file  Intimate Partner Violence: Not on file     Review of Systems  Limited due to cognitive status   Physical Exam   BP (!) 100/50   Pulse (!) 111   Temp 98 F (36.7 C) Comment (Src): forehead General:   Alert and oriented. Pleasant and cooperative. Well-nourished and well-developed.  Head:  Normocephalic and atraumatic. Eyes:  Without icterus Abdomen:  +BS, soft, non-tender and non-distended. No HSM noted. No guarding or rebound. No masses appreciated.   Rectal:  Deferred  Msk:  Symmetrical without gross deformities. Normal posture. Extremities:  Without edema. Neurologic:  Alert and  oriented x4;  grossly normal neurologically. Skin:  Intact without significant lesions or rashes. Psych:  Alert and cooperative. Normal mood and affect.   Assessment   Brittany Hanson is an 88 y.o. female presenting today in follow-up with a history of dementia, afib on eliquis, chronic constipation, GI bleed with with hospitalization in April 2023 undergoing colonoscopy/EGD unrevealing.   Constipation: on dulcolax currently but needs more aggressive regimen. Will trial Linzess 72 mcg daily.  History of GI bleed: update CBC now. Was unable to swallow capsule. May need EGD with capsule if persistent anemia.     PLAN    Trial of Linzess 72 mcg daily Call with update CBC today 4 month return   Annitta Needs, PhD, [facility] Gastroenterology

## 2021-10-01 NOTE — Patient Instructions (Signed)
Let's start Linzess one capsule 30 minutes before breakfast. You can open this up and mix in coffee or water.   Please call with an update in about a week with how this is doing.  Please have blood work done today.  We will see you in 4 months!  It was a pleasure to see you today. I want to create trusting relationships with patients to provide genuine, compassionate, and quality care. I value your feedback. If you receive a survey regarding your visit,  I greatly appreciate you taking time to fill this out.   Annitta Needs, PhD, ANP-BC [facility] Gastroenterology

## 2021-10-04 DIAGNOSIS — K922 Gastrointestinal hemorrhage, unspecified: Secondary | ICD-10-CM | POA: Diagnosis not present

## 2021-10-04 DIAGNOSIS — I10 Essential (primary) hypertension: Secondary | ICD-10-CM | POA: Diagnosis not present

## 2021-10-04 DIAGNOSIS — M170 Bilateral primary osteoarthritis of knee: Secondary | ICD-10-CM | POA: Diagnosis not present

## 2021-10-04 DIAGNOSIS — I4891 Unspecified atrial fibrillation: Secondary | ICD-10-CM | POA: Diagnosis not present

## 2021-10-04 DIAGNOSIS — Z7901 Long term (current) use of anticoagulants: Secondary | ICD-10-CM | POA: Diagnosis not present

## 2021-10-04 DIAGNOSIS — K5909 Other constipation: Secondary | ICD-10-CM | POA: Diagnosis not present

## 2021-10-04 DIAGNOSIS — D509 Iron deficiency anemia, unspecified: Secondary | ICD-10-CM | POA: Diagnosis not present

## 2021-10-04 DIAGNOSIS — R69 Illness, unspecified: Secondary | ICD-10-CM | POA: Diagnosis not present

## 2021-10-08 DIAGNOSIS — K922 Gastrointestinal hemorrhage, unspecified: Secondary | ICD-10-CM | POA: Diagnosis not present

## 2021-10-08 DIAGNOSIS — I4891 Unspecified atrial fibrillation: Secondary | ICD-10-CM | POA: Diagnosis not present

## 2021-10-08 DIAGNOSIS — R69 Illness, unspecified: Secondary | ICD-10-CM | POA: Diagnosis not present

## 2021-10-08 DIAGNOSIS — M170 Bilateral primary osteoarthritis of knee: Secondary | ICD-10-CM | POA: Diagnosis not present

## 2021-10-08 DIAGNOSIS — D509 Iron deficiency anemia, unspecified: Secondary | ICD-10-CM | POA: Diagnosis not present

## 2021-10-08 DIAGNOSIS — K5909 Other constipation: Secondary | ICD-10-CM | POA: Diagnosis not present

## 2021-10-08 DIAGNOSIS — I10 Essential (primary) hypertension: Secondary | ICD-10-CM | POA: Diagnosis not present

## 2021-10-08 DIAGNOSIS — Z7901 Long term (current) use of anticoagulants: Secondary | ICD-10-CM | POA: Diagnosis not present

## 2021-10-09 ENCOUNTER — Other Ambulatory Visit: Payer: Self-pay

## 2021-10-09 DIAGNOSIS — K5900 Constipation, unspecified: Secondary | ICD-10-CM

## 2021-10-09 DIAGNOSIS — K922 Gastrointestinal hemorrhage, unspecified: Secondary | ICD-10-CM

## 2021-10-11 ENCOUNTER — Encounter: Payer: Self-pay | Admitting: Family Medicine

## 2021-10-11 ENCOUNTER — Telehealth: Payer: Self-pay

## 2021-10-11 ENCOUNTER — Ambulatory Visit (INDEPENDENT_AMBULATORY_CARE_PROVIDER_SITE_OTHER): Payer: Medicare HMO | Admitting: Family Medicine

## 2021-10-11 DIAGNOSIS — K5904 Chronic idiopathic constipation: Secondary | ICD-10-CM

## 2021-10-11 NOTE — Progress Notes (Unsigned)
Established Patient Office Visit  Subjective:  Patient ID: Brittany Hanson, female    DOB: 01/24/1932  Age: 88 y.o. MRN: [id number]  CC:  Chief Complaint  Patient presents with   Constipation    No bowel movement x 7 days possibly more per daughter, patient states no stomach pain.    HPI Brittany Hanson is a 88 y.o. female with past medical history of *** presents for f/u of *** chronic medical conditions. Onset:  hasnt had a aBM for 7-8 days.  No abdominal pain. Has tried milax, laxative,, dulcolax, drinks coffes, been ggetting salad, , used laxa clear.  Purne juice yesterdayy  Linzes: for 5 days and hasnt had a abowelmovement  Last BM  on 09/07/21  From [facility]  Last BM 10/07/21  Will starts prucalopride  Past Medical History:  Diagnosis Date   Atrial fibrillation ([place])    Dementia ([place])    Hypertension    Osteoarthritis     Past Surgical History:  Procedure Laterality Date   COLONOSCOPY WITH PROPOFOL N/A 08/14/2021   internal/external hemorrhoids   ESOPHAGOGASTRODUODENOSCOPY (EGD) WITH PROPOFOL N/A 08/14/2021   2 cm sliding hiatal hernia otherwise normal   GIVENS CAPSULE STUDY N/A 08/15/2021   Procedure: GIVENS CAPSULE STUDY;  Surgeon: Daneil Dolin, MD;  Location[facility];  Service: Endoscopy;  Laterality: N/A;    Family History  Problem Relation Age of Onset   Diabetes Sister    Cancer Daughter     Social History   Socioeconomic History   Marital status: Widowed    Spouse name: Not on file   Number of children: 3   Years of education: 6th   Highest education level: Not on file  Occupational History   Not on file  Tobacco Use   Smoking status: Never   Smokeless tobacco: Never  Vaping Use   Vaping Use: Never used  Substance and Sexual Activity   Alcohol use: Never   Drug use: Never   Sexual activity: Not Currently  Other Topics Concern   Not on file  Social History Narrative   Not on file   Social Determinants of Health   Financial  Resource Strain: Not on file  Food Insecurity: Not on file  Transportation Needs: Not on file  Physical Activity: Not on file  Stress: Not on file  Social Connections: Not on file  Intimate Partner Violence: Not on file    Outpatient Medications Prior to Visit  Medication Sig Dispense Refill   Acetaminophen (TYLENOL ARTHRITIS PAIN PO) Take 2 tablets by mouth in the morning.     bisacodyl 5 MG EC tablet Take 2 tablets (10 mg total) by mouth daily as needed for moderate constipation. 20 tablet 0   donepezil (ARICEPT) 10 MG tablet TAKE 1 TABLET BY MOUTH EVERYDAY AT BEDTIME 90 tablet 0   ELIQUIS 5 MG TABS tablet Take 1 tablet (5 mg total) by mouth 2 (two) times daily. 60 tablet 0   latanoprost (XALATAN) 0.005 % ophthalmic solution Place 1 drop into both eyes at bedtime.     memantine (NAMENDA) 5 MG tablet Take 5 mg by mouth 2 (two) times daily.     metoprolol succinate (TOPROL-XL) 50 MG 24 hr tablet TAKE 1 TABLET BY MOUTH EVERY DAY 90 tablet 0   simvastatin (ZOCOR) 20 MG tablet TAKE 1 TABLET BY MOUTH EVERYDAY AT BEDTIME (Patient taking differently: Take 20 mg by mouth daily at 6 PM.) 90 tablet 0   No facility-administered  medications prior to visit.    No Known Allergies  ROS Review of Systems  Constitutional:  Negative for chills and fever.  Respiratory:  Negative for chest tightness and shortness of breath.   Cardiovascular:  Negative for chest pain and palpitations.  Gastrointestinal:  Positive for constipation. Negative for abdominal distention, abdominal pain, anal bleeding, nausea and vomiting.     Objective:    Physical Exam HENT:     Head: Normocephalic.  Cardiovascular:     Rate and Rhythm: Normal rate.     Pulses: Normal pulses.     Heart sounds: Normal heart sounds.  Pulmonary:     Effort: Pulmonary effort is normal.     Breath sounds: Normal breath sounds.  Abdominal:     General: Bowel sounds are normal. There is no distension.     Palpations: Abdomen is soft.  There is no mass.     Tenderness: There is no abdominal tenderness. There is no guarding.  Neurological:     Mental Status: She is alert.    BP 114/77   Pulse 69   Ht $R'5\' 5"'Brittany Hanson$  (1.651 m)   Wt 163 lb 9.6 oz (74.2 kg)   SpO2 95%   BMI 27.22 kg/m  Wt Readings from Last 3 Encounters:  10/11/21 163 lb 9.6 oz (74.2 kg)  09/13/21 165 lb (74.8 kg)  08/26/21 183 lb (83 kg)    No results found for: TSH Lab Results  Component Value Date   WBC 6.4 10/01/2021   HGB 11.0 (L) 10/01/2021   HCT 35.1 10/01/2021   MCV 84.0 10/01/2021   PLT 226 10/01/2021   Lab Results  Component Value Date   NA 143 08/26/2021   K 4.7 08/26/2021   CO2 26 08/26/2021   GLUCOSE 112 (H) 08/26/2021   BUN 16 08/26/2021   CREATININE 1.02 (H) 08/26/2021   BILITOT 0.4 08/26/2021   ALKPHOS 90 08/26/2021   AST 19 08/26/2021   ALT 10 08/26/2021   PROT 6.1 08/26/2021   ALBUMIN 4.1 08/26/2021   CALCIUM 10.1 08/26/2021   ANIONGAP 7 08/13/2021   EGFR 53 (L) 08/26/2021   No results found for: CHOL No results found for: HDL No results found for: LDLCALC No results found for: TRIG No results found for: CHOLHDL No results found for: HGBA1C    Assessment & Plan:   Problem List Items Addressed This Visit   None   No orders of the defined types were placed in this encounter.   Follow-up: No follow-ups on file.    Alvira Monday, FNP

## 2021-10-11 NOTE — Telephone Encounter (Signed)
Pt's caretaker phoned this morning stating that the Linzess did not work. She has been giving the pt the Dulcolax. Pt still has not had a BM in 8 days they stated. I seen a CT report from April and I dont think the ordering Dr gave her the results. At that time she had a stool ball in the rectum. Please advise

## 2021-10-11 NOTE — Patient Instructions (Signed)
I appreciate the opportunity to provide care to you today!    Constipation: -Please increase fiber and fluid intake and physical activity  - Continue taking linzess for 1 more week and f/u with GI if unable to have a BM -if you're not able to reach GI, contact us. Risk factors for primary or secondary constipation include age > 86 years old low fiber diet female sex lack of physical activity   Please continue to a heart-healthy diet and increase your physical activities. Try to exercise for 10mins at least three times a week.      It was a pleasure to see you and I look forward to continuing to work together on your health and well-being. Please do not hesitate to call the office if you need care or have questions about your care.   Have a wonderful day and week. With Gratitude, Alvira Monday MSN, FNP-BC

## 2021-10-12 NOTE — Assessment & Plan Note (Signed)
-  given that history is unclear regarding the patient's last BM , I recommend continuing linzess for another week with vigilant monitoring of patient - encouraged to continue high fiber diet, increase fluids intake, and increase physical activities Advised to contact GI if unable to have a bowel movement; if unable to reach GI, contact us. -will consider a trial of prucalopride if treatment failure with linzess

## 2021-10-14 ENCOUNTER — Other Ambulatory Visit: Payer: Self-pay | Admitting: Gastroenterology

## 2021-10-14 MED ORDER — LUBIPROSTONE 8 MCG PO CAPS: 8.0000 ug | ORAL_CAPSULE | Freq: Two times a day (BID) | ORAL | 3 refills | Status: DC

## 2021-10-14 NOTE — Telephone Encounter (Signed)
How did she do on Linzess 72 mcg daily? If not helpful, I am sending in Amitiza to take twice a day WITH FOOD to avoid nausea. This will be at the pharmacy for her to take. She is not having good results with just OTC options.

## 2021-10-14 NOTE — Addendum Note (Signed)
Addended by: Annitta Needs on: 10/14/2021 03:12 PM   Modules accepted: Orders

## 2021-10-14 NOTE — Telephone Encounter (Signed)
FYI:  Phoned and spoke with the pt's daughter because the pt was sleep. Advised of the Rx and the instructions to it. Pt's daughter stated that she found out over the weekend that maybe the Linzess did work because the pt is up all night when they are asleep, but she's going with the Amitiza.

## 2021-10-16 ENCOUNTER — Other Ambulatory Visit: Payer: Self-pay | Admitting: Gastroenterology

## 2021-10-16 ENCOUNTER — Telehealth: Payer: Self-pay | Admitting: Gastroenterology

## 2021-10-16 MED ORDER — LINACLOTIDE 72 MCG PO CAPS
72.0000 ug | ORAL_CAPSULE | Freq: Every day | ORAL | 3 refills | Status: DC
Start: 2021-10-16 — End: 2022-07-17

## 2021-10-16 NOTE — Telephone Encounter (Signed)
Brittany Hanson, She wants Linzess because it works better. There's another note you missed.

## 2021-10-16 NOTE — Telephone Encounter (Signed)
[place]. Make sure she doesn't take Amitiza with this. I have sent in [place] instead.

## 2021-10-16 NOTE — Telephone Encounter (Signed)
Patient would like linzess sent to [facility] [address]

## 2021-10-16 NOTE — Addendum Note (Signed)
Addended by: Gelene Mink on: 10/16/2021 03:36 PM   Modules accepted: Orders

## 2021-10-16 NOTE — Telephone Encounter (Signed)
Brittany Hanson, Pt decided the wanted Linzess after all because it does work for the pt. Please send to [facility] on [address] in [place]

## 2021-10-16 NOTE — Telephone Encounter (Signed)
Phoned and advised the pt and her caretaker of the Linzess being sent in and not to take the Amitiza with it. They expressed understanding.

## 2021-10-16 NOTE — Telephone Encounter (Signed)
Dena, can we see if this needs a PA?  

## 2021-10-17 ENCOUNTER — Other Ambulatory Visit: Payer: Self-pay | Admitting: Gastroenterology

## 2021-10-30 DIAGNOSIS — H401122 Primary open-angle glaucoma, left eye, moderate stage: Secondary | ICD-10-CM | POA: Diagnosis not present

## 2021-10-30 DIAGNOSIS — H25813 Combined forms of age-related cataract, bilateral: Secondary | ICD-10-CM | POA: Diagnosis not present

## 2021-10-30 DIAGNOSIS — H401111 Primary open-angle glaucoma, right eye, mild stage: Secondary | ICD-10-CM | POA: Diagnosis not present

## 2021-11-20 ENCOUNTER — Ambulatory Visit (INDEPENDENT_AMBULATORY_CARE_PROVIDER_SITE_OTHER): Payer: Medicare HMO | Admitting: Internal Medicine

## 2021-11-20 ENCOUNTER — Encounter: Payer: Self-pay | Admitting: Internal Medicine

## 2021-11-20 VITALS — BP 102/62 | HR 70 | Resp 16 | Ht 65.0 in | Wt 164.0 lb

## 2021-11-20 DIAGNOSIS — I10 Essential (primary) hypertension: Secondary | ICD-10-CM

## 2021-11-20 DIAGNOSIS — Z0001 Encounter for general adult medical examination with abnormal findings: Secondary | ICD-10-CM | POA: Diagnosis not present

## 2021-11-20 DIAGNOSIS — R69 Illness, unspecified: Secondary | ICD-10-CM | POA: Diagnosis not present

## 2021-11-20 DIAGNOSIS — Z23 Encounter for immunization: Secondary | ICD-10-CM

## 2021-11-20 DIAGNOSIS — G309 Alzheimer's disease, unspecified: Secondary | ICD-10-CM | POA: Diagnosis not present

## 2021-11-20 DIAGNOSIS — N1831 Chronic kidney disease, stage 3a: Secondary | ICD-10-CM | POA: Diagnosis not present

## 2021-11-20 DIAGNOSIS — F02B0 Dementia in other diseases classified elsewhere, moderate, without behavioral disturbance, psychotic disturbance, mood disturbance, and anxiety: Secondary | ICD-10-CM

## 2021-11-20 DIAGNOSIS — K5904 Chronic idiopathic constipation: Secondary | ICD-10-CM | POA: Diagnosis not present

## 2021-11-20 DIAGNOSIS — I4821 Permanent atrial fibrillation: Secondary | ICD-10-CM | POA: Diagnosis not present

## 2021-11-20 DIAGNOSIS — E559 Vitamin D deficiency, unspecified: Secondary | ICD-10-CM

## 2021-11-20 NOTE — Progress Notes (Signed)
Established Patient Office Visit  Subjective:  Patient ID: Brittany Hanson, female    DOB: [birth year]  Age: 88 y.o. MRN: [id number]  CC:  Chief Complaint  Patient presents with   Annual Exam    Annual exam     HPI Brittany Hanson is a 88 y.o. female with past medical history of atrial fibrillation, HTN, Alzheimer's dementia, diffuse OA and physical deconditioning who presents for annual physical.  HTN: BP is well-controlled. Takes medications regularly. Patient denies headache, dizziness, chest pain, dyspnea or palpitations.   Atrial fibrillation: She is followed by cardiology in [place].  She is on metoprolol and Eliquis currently.  She does not have any history of cardiac procedure in the past.  Alzheimer's dementia: She is on donepezil and memantine currently.  She is dependent for her ADLs.  She is able to eat by herself, but daughter has to help prepare the meals, bathing and clothing.  She watches TV during the nighttime and and sleeps during the daytime.  Daughter denies any episodes of agitation, delusion, hallucinations or wandering.  Constipation has improved with Linzess now.  Denies any melena or hematochezia now.     Past Medical History:  Diagnosis Date   Atrial fibrillation (Brittany Hanson)    Dementia (Brittany Hanson)    Hypertension    Osteoarthritis     Past Surgical History:  Procedure Laterality Date   COLONOSCOPY WITH PROPOFOL N/A 08/14/2021   internal/external hemorrhoids   ESOPHAGOGASTRODUODENOSCOPY (EGD) WITH PROPOFOL N/A 08/14/2021   2 cm sliding hiatal hernia otherwise normal   GIVENS CAPSULE STUDY N/A 08/15/2021   Procedure: GIVENS CAPSULE STUDY;  Surgeon: Daneil Dolin, MD;  [facility];  Service: Endoscopy;  Laterality: N/A;    Family History  Problem Relation Age of Onset   Diabetes Sister    Cancer Daughter     Social History   Socioeconomic History   Marital status: Widowed    Spouse name: Not on file   Number of children: 3   Years  of education: 6th   Highest education level: Not on file  Occupational History   Not on file  Tobacco Use   Smoking status: Never   Smokeless tobacco: Never  Vaping Use   Vaping Use: Never used  Substance and Sexual Activity   Alcohol use: Never   Drug use: Never   Sexual activity: Not Currently  Other Topics Concern   Not on file  Social History Narrative   Not on file   Social Determinants of Health   Financial Resource Strain: Not on file  Food Insecurity: Not on file  Transportation Needs: Not on file  Physical Activity: Not on file  Stress: Not on file  Social Connections: Not on file  Intimate Partner Violence: Not on file    Outpatient Medications Prior to Visit  Medication Sig Dispense Refill   Acetaminophen (TYLENOL ARTHRITIS PAIN PO) Take 2 tablets by mouth in the morning.     bisacodyl 5 MG EC tablet Take 2 tablets (10 mg total) by mouth daily as needed for moderate constipation. 20 tablet 0   donepezil (ARICEPT) 10 MG tablet TAKE 1 TABLET BY MOUTH EVERYDAY AT BEDTIME 90 tablet 0   ELIQUIS 5 MG TABS tablet Take 1 tablet (5 mg total) by mouth 2 (two) times daily. 60 tablet 0   latanoprost (XALATAN) 0.005 % ophthalmic solution Place 1 drop into both eyes at bedtime.     linaclotide (LINZESS) 72 MCG capsule Take 1  capsule (72 mcg total) by mouth daily before breakfast. 90 capsule 3   lubiprostone (AMITIZA) 8 MCG capsule Take 1 capsule (8 mcg total) by mouth 2 (two) times daily with a meal. 60 capsule 3   memantine (NAMENDA) 5 MG tablet Take 5 mg by mouth 2 (two) times daily.     metoprolol succinate (TOPROL-XL) 50 MG 24 hr tablet TAKE 1 TABLET BY MOUTH EVERY DAY 90 tablet 0   simvastatin (ZOCOR) 20 MG tablet TAKE 1 TABLET BY MOUTH EVERYDAY AT BEDTIME (Patient taking differently: Take 20 mg by mouth daily at 6 PM.) 90 tablet 0   No facility-administered medications prior to visit.    No Known Allergies  ROS Review of Systems  Constitutional:  Positive for  fatigue. Negative for chills and fever.  HENT:  Negative for congestion, sinus pressure, sinus pain and sore throat.   Eyes:  Negative for pain and discharge.  Respiratory:  Negative for cough and shortness of breath.   Cardiovascular:  Negative for chest pain and palpitations.  Gastrointestinal:  Positive for constipation. Negative for abdominal pain, diarrhea, nausea and vomiting.  Endocrine: Negative for polydipsia and polyuria.  Genitourinary:  Negative for dysuria and hematuria.  Musculoskeletal:  Positive for arthralgias, back pain and gait problem. Negative for neck pain and neck stiffness.  Skin:  Negative for rash.  Neurological:  Negative for dizziness and weakness.  Psychiatric/Behavioral:  Positive for decreased concentration and sleep disturbance. Negative for agitation and behavioral problems.       Objective:    Physical Exam Vitals reviewed.  Constitutional:      General: She is not in acute distress.    Appearance: She is not diaphoretic.     Comments: In wheelchair  HENT:     Head: Normocephalic and atraumatic.     Mouth/Throat:     Mouth: Mucous membranes are moist.  Eyes:     General: No scleral icterus.    Extraocular Movements: Extraocular movements intact.  Cardiovascular:     Rate and Rhythm: Normal rate. Rhythm irregular.     Pulses: Normal pulses.     Heart sounds: Normal heart sounds. No murmur heard. Pulmonary:     Breath sounds: Normal breath sounds. No wheezing or rales.  Abdominal:     Palpations: Abdomen is soft.     Tenderness: There is no abdominal tenderness.  Musculoskeletal:        General: Swelling (B/l knee) present.     Cervical back: Neck supple. No tenderness.     Right lower leg: No edema.     Left lower leg: No edema.     Comments: ROM limited at b/l knee up to 45 degrees  Skin:    General: Skin is warm.     Findings: No rash.  Neurological:     General: No focal deficit present.     Mental Status: She is alert. Mental  status is at baseline.     Motor: Weakness (RLE and LLE - 2/5, RUE and LUE - 2/5) present.     Gait: Gait abnormal (Non-ambulatory mostly).  Psychiatric:        Mood and Affect: Mood normal.        Behavior: Behavior normal.     BP 102/62 (BP Location: Left Arm, Patient Position: Sitting, Cuff Size: Normal)   Pulse 70   Resp 16   Ht _0  (1.651 m)   Wt 164 lb (74.4 kg)   SpO2 95%   BMI 27.29  kg/m  Wt Readings from Last 3 Encounters:  11/20/21 164 lb (74.4 kg)  10/11/21 163 lb 9.6 oz (74.2 kg)  09/13/21 165 lb (74.8 kg)    No results found for: "TSH" Lab Results  Component Value Date   WBC 6.4 10/01/2021   HGB 11.0 (L) 10/01/2021   HCT 35.1 10/01/2021   MCV 84.0 10/01/2021   PLT 226 10/01/2021   Lab Results  Component Value Date   NA 143 08/26/2021   K 4.7 08/26/2021   CO2 26 08/26/2021   GLUCOSE 112 (H) 08/26/2021   BUN 16 08/26/2021   CREATININE 1.02 (H) 08/26/2021   BILITOT 0.4 08/26/2021   ALKPHOS 90 08/26/2021   AST 19 08/26/2021   ALT 10 08/26/2021   PROT 6.1 08/26/2021   ALBUMIN 4.1 08/26/2021   CALCIUM 10.1 08/26/2021   ANIONGAP 7 08/13/2021   EGFR 53 (L) 08/26/2021   No results found for: "CHOL" No results found for: "HDL" No results found for: "LDLCALC" No results found for: "TRIG" No results found for: "CHOLHDL" No results found for: "HGBA1C"    Assessment & Plan:   Problem List Items Addressed This Visit       Cardiovascular and Mediastinum   Atrial fibrillation ([place])    Rate-controlled with Metoprolol On Eliquis Followed by Cardiology in [place]      Relevant Orders   TSH   Essential hypertension    BP Readings from Last 1 Encounters:  11/20/21 102/62  Well-controlled with Metoprolol Counseled for compliance with the medications Advised DASH diet        Nervous and Auditory   Dementia ([place])    On donepezil and memantine A&O X 3 currently, but sleeps at daytime and stays awake during nighttime No episodes of agitation,  but does not talk much with other people than her daughter and son-in-law      Relevant Orders   TSH   CMP14+EGFR     Genitourinary   Stage 3a chronic kidney disease ([place])   Relevant Orders   CMP14+EGFR     Other   Constipation    Improved with Linzess now Followed by GI      Encounter for general adult medical examination with abnormal findings - Primary    Physical exam as documented. Fasting blood tests today. PCV20 vaccine today. Advised to get Shingrix vaccine at local pharmacy.      Relevant Orders   TSH   Lipid panel   Hemoglobin A1c   CMP14+EGFR   CBC with Differential/Platelet   Other Visit Diagnoses     Vitamin D deficiency       Relevant Orders   VITAMIN D 25 Hydroxy (Vit-D Deficiency, Fractures)       No orders of the defined types were placed in this encounter.   Follow-up: Return in about 6 months (around 05/23/2022).    Lindell Spar, MD

## 2021-11-20 NOTE — Assessment & Plan Note (Signed)
Rate-controlled with Metoprolol On Eliquis Followed by Cardiology in [place] 

## 2021-11-20 NOTE — Patient Instructions (Signed)
Please continue taking medications as prescribed.  Please maintain at least 50 ounces of fluid intake and eat at regular intervals.  Avoid daytime naps and try to maintain regular sleep-wake cycle.

## 2021-11-20 NOTE — Assessment & Plan Note (Signed)
On donepezil and memantine A&O X 3 currently, but sleeps at daytime and stays awake during nighttime No episodes of agitation, but does not talk much with other people than her daughter and son-in-law

## 2021-11-20 NOTE — Assessment & Plan Note (Signed)
Physical exam as documented. Fasting blood tests today. PCV20 vaccine today. Advised to get Shingrix vaccine at local pharmacy.

## 2021-11-20 NOTE — Assessment & Plan Note (Addendum)
BP Readings from Last 1 Encounters:  11/20/21 102/62   Well-controlled with Metoprolol Counseled for compliance with the medications Advised DASH diet

## 2021-11-20 NOTE — Assessment & Plan Note (Signed)
Improved with Linzess now Followed by GI

## 2021-11-20 NOTE — Addendum Note (Signed)
Addended by: Ishmael Holter R on: 11/20/2021 02:26 PM   Modules accepted: Orders

## 2021-11-21 LAB — CBC WITH DIFFERENTIAL/PLATELET
Basophils Absolute: 0.1 10*3/uL (ref 0.0–0.2)
Basos: 2 %
EOS (ABSOLUTE): 0.2 10*3/uL (ref 0.0–0.4)
Eos: 4 %
Hematocrit: 32.9 % — ABNORMAL LOW (ref 34.0–46.6)
Hemoglobin: 10.2 g/dL — ABNORMAL LOW (ref 11.1–15.9)
Immature Grans (Abs): 0 10*3/uL (ref 0.0–0.1)
Immature Granulocytes: 0 %
Lymphocytes Absolute: 0.9 10*3/uL (ref 0.7–3.1)
Lymphs: 19 %
MCH: 26.1 pg — ABNORMAL LOW (ref 26.6–33.0)
MCHC: 31 g/dL — ABNORMAL LOW (ref 31.5–35.7)
MCV: 84 fL (ref 79–97)
Monocytes Absolute: 0.5 10*3/uL (ref 0.1–0.9)
Monocytes: 11 %
Neutrophils Absolute: 2.9 10*3/uL (ref 1.4–7.0)
Neutrophils: 64 %
Platelets: 222 10*3/uL (ref 150–450)
RBC: 3.91 x10E6/uL (ref 3.77–5.28)
RDW: 15.2 % (ref 11.7–15.4)
WBC: 4.5 10*3/uL (ref 3.4–10.8)

## 2021-11-21 LAB — CMP14+EGFR
ALT: 8 IU/L (ref 0–32)
AST: 11 IU/L (ref 0–40)
Albumin/Globulin Ratio: 2 (ref 1.2–2.2)
Albumin: 4.1 g/dL (ref 3.7–4.7)
Alkaline Phosphatase: 99 IU/L (ref 44–121)
BUN/Creatinine Ratio: 17 (ref 12–28)
BUN: 20 mg/dL (ref 8–27)
Bilirubin Total: 0.3 mg/dL (ref 0.0–1.2)
CO2: 22 mmol/L (ref 20–29)
Calcium: 9.8 mg/dL (ref 8.7–10.3)
Chloride: 106 mmol/L (ref 96–106)
Creatinine, Ser: 1.15 mg/dL — ABNORMAL HIGH (ref 0.57–1.00)
Globulin, Total: 2.1 g/dL (ref 1.5–4.5)
Glucose: 111 mg/dL — ABNORMAL HIGH (ref 70–99)
Potassium: 5.1 mmol/L (ref 3.5–5.2)
Sodium: 143 mmol/L (ref 134–144)
Total Protein: 6.2 g/dL (ref 6.0–8.5)
eGFR: 46 mL/min/{1.73_m2} — ABNORMAL LOW (ref >59)

## 2021-11-21 LAB — LIPID PANEL
Chol/HDL Ratio: 2.3 ratio (ref 0.0–4.4)
Cholesterol, Total: 139 mg/dL (ref 100–199)
HDL: 60 mg/dL (ref >39)
LDL Chol Calc (NIH): 65 mg/dL (ref 0–99)
Triglycerides: 72 mg/dL (ref 0–149)
VLDL Cholesterol Cal: 14 mg/dL (ref 5–40)

## 2021-11-21 LAB — VITAMIN D 25 HYDROXY (VIT D DEFICIENCY, FRACTURES): Vit D, 25-Hydroxy: 34.5 ng/mL (ref 30.0–100.0)

## 2021-11-21 LAB — HEMOGLOBIN A1C
Est. average glucose Bld gHb Est-mCnc: 117 mg/dL
Hgb A1c MFr Bld: 5.7 % — ABNORMAL HIGH (ref 4.8–5.6)

## 2021-11-21 LAB — TSH: TSH: 2.78 u[IU]/mL (ref 0.450–4.500)

## 2021-11-26 ENCOUNTER — Telehealth: Payer: Self-pay | Admitting: Internal Medicine

## 2021-11-26 ENCOUNTER — Other Ambulatory Visit: Payer: Self-pay | Admitting: *Deleted

## 2021-11-26 MED ORDER — MEMANTINE HCL 5 MG PO TABS
5.0000 mg | ORAL_TABLET | Freq: Two times a day (BID) | ORAL | 0 refills | Status: DC
Start: 2021-11-26 — End: 2022-02-11

## 2021-11-26 NOTE — Telephone Encounter (Signed)
Tried to records message & it opened a new one

## 2021-11-26 NOTE — Telephone Encounter (Signed)
Pt daughter called stating that she is out of memantine (NAMENDA) 5 MG tablet. States phar was supposed to have already sent a refill request. Can you please refill?    memantine (NAMENDA) 5 MG tablet    [facility]

## 2021-11-26 NOTE — Telephone Encounter (Signed)
No fax request received from pharmacy.  Pt medication sent to pharmacy

## 2021-12-15 ENCOUNTER — Other Ambulatory Visit: Payer: Self-pay | Admitting: Internal Medicine

## 2022-01-21 DIAGNOSIS — Z7901 Long term (current) use of anticoagulants: Secondary | ICD-10-CM | POA: Diagnosis not present

## 2022-01-21 DIAGNOSIS — E785 Hyperlipidemia, unspecified: Secondary | ICD-10-CM | POA: Diagnosis not present

## 2022-01-21 DIAGNOSIS — I10 Essential (primary) hypertension: Secondary | ICD-10-CM | POA: Diagnosis not present

## 2022-01-21 DIAGNOSIS — I482 Chronic atrial fibrillation, unspecified: Secondary | ICD-10-CM | POA: Diagnosis not present

## 2022-01-24 ENCOUNTER — Telehealth: Payer: Self-pay | Admitting: Internal Medicine

## 2022-01-24 NOTE — Telephone Encounter (Signed)
Patient advised with verbal understanding  

## 2022-01-24 NOTE — Telephone Encounter (Signed)
Message has been sent once before see other tele message

## 2022-01-24 NOTE — Telephone Encounter (Signed)
Bonita Quin called in on patient behalf. Patient has been having black stool since taking iron pills. Wants call back in regard to if iron pill is the reasoning for black stools.

## 2022-01-24 NOTE — Telephone Encounter (Signed)
Pt daughter called stating that she has recently prescribed iron medication & now her bm are dark black/gray. Daughter is wanting to know if this is how it is supposed to be? She has not gave it to her today.

## 2022-02-11 ENCOUNTER — Encounter: Payer: Self-pay | Admitting: Gastroenterology

## 2022-02-11 ENCOUNTER — Ambulatory Visit (INDEPENDENT_AMBULATORY_CARE_PROVIDER_SITE_OTHER): Payer: Medicare HMO | Admitting: Gastroenterology

## 2022-02-11 VITALS — BP 107/75 | HR 77 | Temp 97.2°F | Ht 65.0 in | Wt 154.0 lb

## 2022-02-11 DIAGNOSIS — K5900 Constipation, unspecified: Secondary | ICD-10-CM

## 2022-02-11 DIAGNOSIS — R634 Abnormal weight loss: Secondary | ICD-10-CM

## 2022-02-11 DIAGNOSIS — D509 Iron deficiency anemia, unspecified: Secondary | ICD-10-CM | POA: Diagnosis not present

## 2022-02-11 NOTE — Progress Notes (Signed)
Gastroenterology Office Note     Primary Care Physician:  Lindell Spar, MD  Primary Gastroenterologist: Dr. Abbey Chatters    Chief Complaint   Chief Complaint  Patient presents with   Follow-up    Follow up on constipation. [employer] working for the pt     History of Present Illness   Brittany Hanson is an 88 y.o. female presenting today in follow-up with a history of dementia, afib on eliquis, chronic constipation, history of GI bleed in April 2023 s/p EGD/colonoscopy but unable to swallow capsule.   Last labs in July 2023 with Hgb 10.2. Ferritin low at 10 in April 2023. No overt GI bleeding. She is on daily iron now. Stool dark on iron.   Denies straining. Has BM usually every other day. History of dementia, so HPI is limited. Family member present with her today. Noted to have unintentional weight loss. Approximately 10 lbs down from May 2023. Denies abdominal pain or postprandial abdominal pain. Reports good appetite.     Past Medical History:  Diagnosis Date   Atrial fibrillation ([place])    Dementia ([place])    Hypertension    Osteoarthritis     Past Surgical History:  Procedure Laterality Date   COLONOSCOPY WITH PROPOFOL N/A 08/14/2021   internal/external hemorrhoids   ESOPHAGOGASTRODUODENOSCOPY (EGD) WITH PROPOFOL N/A 08/14/2021   2 cm sliding hiatal hernia otherwise normal   GIVENS CAPSULE STUDY N/A 08/15/2021   Procedure: GIVENS CAPSULE STUDY;  Surgeon: Daneil Dolin, MD;  [facility];  Service: Endoscopy;  Laterality: N/A;    Current Outpatient Medications  Medication Sig Dispense Refill   Acetaminophen (TYLENOL ARTHRITIS PAIN PO) Take 2 tablets by mouth in the morning.     donepezil (ARICEPT) 10 MG tablet TAKE 1 TABLET BY MOUTH EVERYDAY AT BEDTIME 90 tablet 0   ELIQUIS 5 MG TABS tablet Take 1 tablet (5 mg total) by mouth 2 (two) times daily. 60 tablet 0   ferrous sulfate 325 (65 FE) MG tablet Take 325 mg by mouth daily with breakfast.      latanoprost (XALATAN) 0.005 % ophthalmic solution Place 1 drop into both eyes at bedtime.     linaclotide (LINZESS) 72 MCG capsule Take 1 capsule (72 mcg total) by mouth daily before breakfast. 90 capsule 3   metoprolol succinate (TOPROL-XL) 50 MG 24 hr tablet TAKE 1 TABLET BY MOUTH EVERY DAY 90 tablet 0   simvastatin (ZOCOR) 20 MG tablet TAKE 1 TABLET BY MOUTH EVERYDAY AT BEDTIME 90 tablet 0   No current facility-administered medications for this visit.    Allergies as of 02/11/2022   (No Known Allergies)    Family History  Problem Relation Age of Onset   Diabetes Sister    Cancer Daughter     Social History   Socioeconomic History   Marital status: Widowed    Spouse name: Not on file   Number of children: 3   Years of education: 6th   Highest education level: Not on file  Occupational History   Not on file  Tobacco Use   Smoking status: Never   Smokeless tobacco: Never  Vaping Use   Vaping Use: Never used  Substance and Sexual Activity   Alcohol use: Never   Drug use: Never   Sexual activity: Not Currently  Other Topics Concern   Not on file  Social History Narrative   Not on file   Social Determinants of Health   Financial Resource Strain: Not  on file  Food Insecurity: Not on file  Transportation Needs: Not on file  Physical Activity: Not on file  Stress: Not on file  Social Connections: Not on file  Intimate Partner Violence: Not on file     Review of Systems   See HPI   Physical Exam   BP 107/75   Pulse 77   Temp (!) 97.2 F (36.2 C)   Ht 5\' 5"  (1.651 m)   Wt 154 lb (69.9 kg)   BMI 25.63 kg/m  General:   Alert and oriented. Pleasant and cooperative. Well-nourished and well-developed.  Head:  Normocephalic and atraumatic. Eyes:  Without icterus Abdomen:  +BS, soft, non-tender and non-distended. Sitting in wheelchair Rectal:  Deferred  Neurologic:  Alert and  oriented to person and place Psych:  Alert and cooperative. Normal mood and  affect.   Assessment   Brittany Hanson is an 88 y.o. female presenting today in follow-up with a history of dementia, afib on eliquis, chronic constipation, history of GI bleed in April 2023 s/p EGD/colonoscopy but unable to swallow capsule.   IDA: no overt GI bleeding. Iron once daily. Will update CBC, iron studies today. If worsening IDA, needs capsule placement via EGD.   Constipation: doing well on Linzess 72 mcg daily.   Weight loss: unintentional. Denying abdominal pain. Query multifactorial. Will follow.     PLAN  Continue Linzess 72 mcg daily CBC, iron studies today 6 month return   May 2023, PhD, [facility]

## 2022-02-11 NOTE — Patient Instructions (Signed)
Please have blood work done at [facility].  We will see you in 6 months!  I enjoyed seeing you again today! As you know, I value our relationship and want to provide genuine, compassionate, and quality care. I welcome your feedback. If you receive a survey regarding your visit,  I greatly appreciate you taking time to fill this out. See you next time!  Annitta Needs, PhD, ANP-BC [facility]

## 2022-02-12 LAB — CBC WITH DIFFERENTIAL/PLATELET
Basophils Absolute: 0 10*3/uL (ref 0.0–0.2)
Basos: 1 %
EOS (ABSOLUTE): 0.2 10*3/uL (ref 0.0–0.4)
Eos: 3 %
Hematocrit: 41.3 % (ref 34.0–46.6)
Hemoglobin: 13.3 g/dL (ref 11.1–15.9)
Immature Grans (Abs): 0 10*3/uL (ref 0.0–0.1)
Immature Granulocytes: 0 %
Lymphocytes Absolute: 0.9 10*3/uL (ref 0.7–3.1)
Lymphs: 17 %
MCH: 29.3 pg (ref 26.6–33.0)
MCHC: 32.2 g/dL (ref 31.5–35.7)
MCV: 91 fL (ref 79–97)
Monocytes Absolute: 0.5 10*3/uL (ref 0.1–0.9)
Monocytes: 8 %
Neutrophils Absolute: 3.9 10*3/uL (ref 1.4–7.0)
Neutrophils: 71 %
Platelets: 171 10*3/uL (ref 150–450)
RBC: 4.54 x10E6/uL (ref 3.77–5.28)
RDW: 15.3 % (ref 11.7–15.4)
WBC: 5.5 10*3/uL (ref 3.4–10.8)

## 2022-02-12 LAB — IRON,TIBC AND FERRITIN PANEL
Ferritin: 59 ng/mL (ref 15–150)
Iron Saturation: 28 % (ref 15–55)
Iron: 80 ug/dL (ref 27–139)
Total Iron Binding Capacity: 286 ug/dL (ref 250–450)
UIBC: 206 ug/dL (ref 118–369)

## 2022-02-16 ENCOUNTER — Other Ambulatory Visit: Payer: Self-pay | Admitting: Internal Medicine

## 2022-02-18 ENCOUNTER — Encounter: Payer: Self-pay | Admitting: Internal Medicine

## 2022-02-18 ENCOUNTER — Ambulatory Visit (INDEPENDENT_AMBULATORY_CARE_PROVIDER_SITE_OTHER): Payer: Medicare HMO | Admitting: Internal Medicine

## 2022-02-18 VITALS — Ht 65.0 in | Wt 154.0 lb

## 2022-02-18 DIAGNOSIS — R69 Illness, unspecified: Secondary | ICD-10-CM | POA: Diagnosis not present

## 2022-02-18 DIAGNOSIS — F02B0 Dementia in other diseases classified elsewhere, moderate, without behavioral disturbance, psychotic disturbance, mood disturbance, and anxiety: Secondary | ICD-10-CM

## 2022-02-18 DIAGNOSIS — F5105 Insomnia due to other mental disorder: Secondary | ICD-10-CM

## 2022-02-18 DIAGNOSIS — F99 Mental disorder, not otherwise specified: Secondary | ICD-10-CM | POA: Diagnosis not present

## 2022-02-18 DIAGNOSIS — G309 Alzheimer's disease, unspecified: Secondary | ICD-10-CM

## 2022-02-18 MED ORDER — MEMANTINE HCL 5 MG PO TABS: 5.0000 mg | ORAL_TABLET | Freq: Two times a day (BID) | ORAL | 5 refills | Status: DC

## 2022-02-18 NOTE — Patient Instructions (Signed)
Please take melatonin up to 5 mg as needed for regulating sleep-wake cycle.  Please maintain simple sleep hygiene. - Maintain dark and non-noisy environment in the bedroom. - Please use the bedroom for sleep and sexual activity only. - Do not use electronic devices in the bedroom. - Please take dinner at least 2 hours before bedtime. - Please avoid caffeinated products in the evening, including coffee, soft drinks. - Please try to maintain the regular sleep-wake cycle - Go to bed and wake up at the same time.

## 2022-02-18 NOTE — Progress Notes (Signed)
Virtual Visit via Telephone Note   This visit type was conducted via telephone. This format is felt to be most appropriate for this patient at this time.  The patient did not have access to video technology/had technical difficulties with video requiring transitioning to audio format only (telephone).  All issues noted in this document were discussed and addressed.  No physical exam could be performed with this format.  Evaluation Performed:  Follow-up visit  Date:  02/18/2022   ID:  Brittany Hanson, DOB [birth year], MRN [id number]  Patient Location: Home Provider Location: Office/Clinic  Participants: Patient and daughter - Brittany Hanson Location of Patient: Home Location of Provider: Telehealth Consent was obtain for visit to be over via telehealth. I verified that I am speaking with the correct person using two identifiers.  PCP:  Lindell Spar, MD   Chief Complaint: Insomnia  History of Present Illness:    Brittany Hanson is a 88 y.o. female who has a televisit for complaint of insomnia.  Of note, she has history of moderate Alzheimer's dementia and has habit of taking daytime naps.  She usually sleeps in a recliner during daytime and has difficulty sleeping at nighttime.  She reported that she was not able to sleep for the last 2 days, but her daughter reports today that she was able to sleep well last night.  She asked if the patient can take melatonin.  She has been taking donepezil and memantine for dementia.  Denies any episodes of agitation, but daughter is concerned that she may be confused about her sleep lately.  The patient does not have symptoms concerning for COVID-19 infection (fever, chills, cough, or new shortness of breath).   Past Medical, Surgical, Social History, Allergies, and Medications have been Reviewed.  Past Medical History:  Diagnosis Date   Atrial fibrillation ([place])    Dementia ([place])    Hypertension    Osteoarthritis    Past Surgical History:   Procedure Laterality Date   COLONOSCOPY WITH PROPOFOL N/A 08/14/2021   internal/external hemorrhoids   ESOPHAGOGASTRODUODENOSCOPY (EGD) WITH PROPOFOL N/A 08/14/2021   2 cm sliding hiatal hernia otherwise normal   GIVENS CAPSULE STUDY N/A 08/15/2021   Procedure: GIVENS CAPSULE STUDY;  Surgeon: Daneil Dolin, MD;  [facility];  Service: Endoscopy;  Laterality: N/A;     Current Meds  Medication Sig   Acetaminophen (TYLENOL ARTHRITIS PAIN PO) Take 2 tablets by mouth in the morning.   donepezil (ARICEPT) 10 MG tablet TAKE 1 TABLET BY MOUTH EVERYDAY AT BEDTIME   ELIQUIS 5 MG TABS tablet TAKE 1 TABLET BY MOUTH TWICE A DAY   ferrous sulfate 325 (65 FE) MG tablet Take 325 mg by mouth daily with breakfast.   latanoprost (XALATAN) 0.005 % ophthalmic solution Place 1 drop into both eyes at bedtime.   linaclotide (LINZESS) 72 MCG capsule Take 1 capsule (72 mcg total) by mouth daily before breakfast.   metoprolol succinate (TOPROL-XL) 50 MG 24 hr tablet TAKE 1 TABLET BY MOUTH EVERY DAY   simvastatin (ZOCOR) 20 MG tablet TAKE 1 TABLET BY MOUTH EVERYDAY AT BEDTIME     Allergies:   Patient has no known allergies.   ROS:   Please see the history of present illness.     All other systems reviewed and are negative.   Labs/Other Tests and Data Reviewed:    Recent Labs: 11/20/2021: ALT 8; BUN 20; Creatinine, Ser 1.15; Potassium 5.1; Sodium 143; TSH 2.780 02/11/2022: Hemoglobin  13.3; Platelets 171   Recent Lipid Panel Lab Results  Component Value Date/Time   CHOL 139 11/20/2021 01:41 PM   TRIG 72 11/20/2021 01:41 PM   HDL 60 11/20/2021 01:41 PM   CHOLHDL 2.3 11/20/2021 01:41 PM   LDLCALC 65 11/20/2021 01:41 PM    Wt Readings from Last 3 Encounters:  02/18/22 154 lb (69.9 kg)  02/11/22 154 lb (69.9 kg)  11/20/21 164 lb (74.4 kg)     ASSESSMENT & PLAN:    Dementia ([place]) On donepezil and memantine A&O X 3 currently, but sleeps at daytime and stays awake during  nighttime Okay to take melatonin as needed for insomnia No episodes of agitation, but does not talk much with other people than her daughter and son-in-law  Insomnia due to other mental disorder Needs to avoid daytime naps to improve nighttime sleep Melatonin as needed for regulating sleep-wake cycle   Time:   Today, I have spent 9 minutes reviewing the chart, including problem list, medications, and with the patient with telehealth technology discussing the above problems.   Medication Adjustments/Labs and Tests Ordered: Current medicines are reviewed at length with the patient today.  Concerns regarding medicines are outlined above.   Tests Ordered: No orders of the defined types were placed in this encounter.   Medication Changes: No orders of the defined types were placed in this encounter.    Note: This dictation was prepared with Dragon dictation along with smaller phrase technology. Similar sounding words can be transcribed inadequately or may not be corrected upon review. Any transcriptional errors that result from this process are unintentional.      Disposition:  Follow up  Signed, Lindell Spar, MD  02/18/2022 10:45 AM     [facility]

## 2022-02-18 NOTE — Assessment & Plan Note (Signed)
On donepezil and memantine A&O X 3 currently, but sleeps at daytime and stays awake during nighttime Okay to take melatonin as needed for insomnia No episodes of agitation, but does not talk much with other people than her daughter and son-in-law 

## 2022-02-18 NOTE — Assessment & Plan Note (Signed)
Needs to avoid daytime naps to improve nighttime sleep Melatonin as needed for regulating sleep-wake cycle 

## 2022-02-27 ENCOUNTER — Other Ambulatory Visit: Payer: Self-pay

## 2022-02-27 DIAGNOSIS — N1831 Chronic kidney disease, stage 3a: Secondary | ICD-10-CM

## 2022-02-27 DIAGNOSIS — K922 Gastrointestinal hemorrhage, unspecified: Secondary | ICD-10-CM

## 2022-02-27 DIAGNOSIS — D509 Iron deficiency anemia, unspecified: Secondary | ICD-10-CM

## 2022-03-06 DIAGNOSIS — I10 Essential (primary) hypertension: Secondary | ICD-10-CM | POA: Diagnosis not present

## 2022-03-06 DIAGNOSIS — I482 Chronic atrial fibrillation, unspecified: Secondary | ICD-10-CM | POA: Diagnosis not present

## 2022-03-06 DIAGNOSIS — E785 Hyperlipidemia, unspecified: Secondary | ICD-10-CM | POA: Diagnosis not present

## 2022-03-06 DIAGNOSIS — Z7901 Long term (current) use of anticoagulants: Secondary | ICD-10-CM | POA: Diagnosis not present

## 2022-03-17 ENCOUNTER — Other Ambulatory Visit: Payer: Self-pay | Admitting: Internal Medicine

## 2022-03-21 ENCOUNTER — Telehealth: Payer: Self-pay

## 2022-03-21 NOTE — Telephone Encounter (Signed)
Pt's daughter called and advised me that her mom has not had a BM in 2 weeks. She states she is giving her the Linzess 72 mcg, last 3 days heated prune juice, Miralax in her coffee. Pt was here on 02/11/2022 and we were advised that the Linzess 72 mcg works fine. She states her mother has dementia. She states she has checked behind her mother a few times and she didn't see anything. The pt isn't expressing any pain nor nausea. The daughter states she will take her mom to the ED if she expresses any symptoms over the weekend. I advised I can send note to the person who is covering GI at the ED but she states they will wait until you return here at work.

## 2022-03-24 NOTE — Telephone Encounter (Signed)
Pt's daughter returned my call and I advised of the note and instructions to add Miralax X 2 daily to her regimen and she advised me that the pt did have one BM Saturday. But she states she will follow protocol. She expressed understanding

## 2022-03-24 NOTE — Telephone Encounter (Signed)
Phoned the pt's daughter Brittany Hanson and LMOVM for her to call me back regarding the Dr's note and instructions. Phoned the pt herself and no message.

## 2022-03-24 NOTE — Telephone Encounter (Signed)
IF still no BM, continue current regimen, increase Miralax to BID, and recommend suppository to hopefully get things moving.

## 2022-04-12 ENCOUNTER — Other Ambulatory Visit: Payer: Self-pay | Admitting: Internal Medicine

## 2022-04-21 ENCOUNTER — Other Ambulatory Visit: Payer: Self-pay | Admitting: Internal Medicine

## 2022-04-29 DIAGNOSIS — K922 Gastrointestinal hemorrhage, unspecified: Secondary | ICD-10-CM | POA: Diagnosis not present

## 2022-04-29 DIAGNOSIS — D509 Iron deficiency anemia, unspecified: Secondary | ICD-10-CM | POA: Diagnosis not present

## 2022-04-29 DIAGNOSIS — N1831 Chronic kidney disease, stage 3a: Secondary | ICD-10-CM | POA: Diagnosis not present

## 2022-04-30 LAB — CBC WITH DIFFERENTIAL/PLATELET
Absolute Monocytes: 641 cells/uL (ref 200–950)
Basophils Absolute: 49 cells/uL (ref 0–200)
Basophils Relative: 0.8 %
Eosinophils Absolute: 122 cells/uL (ref 15–500)
Eosinophils Relative: 2 %
HCT: 39.7 % (ref 35.0–45.0)
Hemoglobin: 13.2 g/dL (ref 11.7–15.5)
Lymphs Abs: 836 cells/uL — ABNORMAL LOW (ref 850–3900)
MCH: 30.9 pg (ref 27.0–33.0)
MCHC: 33.2 g/dL (ref 32.0–36.0)
MCV: 93 fL (ref 80.0–100.0)
MPV: 11.2 fL (ref 7.5–12.5)
Monocytes Relative: 10.5 %
Neutro Abs: 4453 cells/uL (ref 1500–7800)
Neutrophils Relative %: 73 %
Platelets: 190 10*3/uL (ref 140–400)
RBC: 4.27 10*6/uL (ref 3.80–5.10)
RDW: 12.9 % (ref 11.0–15.0)
Total Lymphocyte: 13.7 %
WBC: 6.1 10*3/uL (ref 3.8–10.8)

## 2022-04-30 LAB — IRON,TIBC AND FERRITIN PANEL
%SAT: 26 % (calc) (ref 16–45)
Ferritin: 39 ng/mL (ref 16–288)
Iron: 88 ug/dL (ref 45–160)
TIBC: 333 mcg/dL (calc) (ref 250–450)

## 2022-05-07 DIAGNOSIS — H401111 Primary open-angle glaucoma, right eye, mild stage: Secondary | ICD-10-CM | POA: Diagnosis not present

## 2022-05-07 DIAGNOSIS — H25813 Combined forms of age-related cataract, bilateral: Secondary | ICD-10-CM | POA: Diagnosis not present

## 2022-05-07 DIAGNOSIS — H401122 Primary open-angle glaucoma, left eye, moderate stage: Secondary | ICD-10-CM | POA: Diagnosis not present

## 2022-05-14 ENCOUNTER — Other Ambulatory Visit: Payer: Self-pay

## 2022-05-14 DIAGNOSIS — K922 Gastrointestinal hemorrhage, unspecified: Secondary | ICD-10-CM

## 2022-05-14 DIAGNOSIS — D509 Iron deficiency anemia, unspecified: Secondary | ICD-10-CM

## 2022-05-24 DIAGNOSIS — R69 Illness, unspecified: Secondary | ICD-10-CM | POA: Diagnosis not present

## 2022-05-26 ENCOUNTER — Telehealth: Payer: Self-pay | Admitting: Internal Medicine

## 2022-05-26 ENCOUNTER — Encounter: Payer: Self-pay | Admitting: Internal Medicine

## 2022-05-26 ENCOUNTER — Ambulatory Visit (INDEPENDENT_AMBULATORY_CARE_PROVIDER_SITE_OTHER): Payer: Medicare HMO | Admitting: Internal Medicine

## 2022-05-26 VITALS — BP 77/55 | HR 72 | Ht 65.0 in | Wt 154.0 lb

## 2022-05-26 DIAGNOSIS — Z2821 Immunization not carried out because of patient refusal: Secondary | ICD-10-CM | POA: Diagnosis not present

## 2022-05-26 DIAGNOSIS — G309 Alzheimer's disease, unspecified: Secondary | ICD-10-CM

## 2022-05-26 DIAGNOSIS — F99 Mental disorder, not otherwise specified: Secondary | ICD-10-CM

## 2022-05-26 DIAGNOSIS — F02B0 Dementia in other diseases classified elsewhere, moderate, without behavioral disturbance, psychotic disturbance, mood disturbance, and anxiety: Secondary | ICD-10-CM | POA: Diagnosis not present

## 2022-05-26 DIAGNOSIS — N1831 Chronic kidney disease, stage 3a: Secondary | ICD-10-CM

## 2022-05-26 DIAGNOSIS — F5105 Insomnia due to other mental disorder: Secondary | ICD-10-CM

## 2022-05-26 DIAGNOSIS — I4821 Permanent atrial fibrillation: Secondary | ICD-10-CM

## 2022-05-26 DIAGNOSIS — K5900 Constipation, unspecified: Secondary | ICD-10-CM

## 2022-05-26 DIAGNOSIS — I10 Essential (primary) hypertension: Secondary | ICD-10-CM | POA: Diagnosis not present

## 2022-05-26 DIAGNOSIS — R69 Illness, unspecified: Secondary | ICD-10-CM | POA: Diagnosis not present

## 2022-05-26 MED ORDER — METOPROLOL SUCCINATE ER 25 MG PO TB24: 25.0000 mg | ORAL_TABLET | Freq: Every day | ORAL | 0 refills | Status: DC

## 2022-05-26 NOTE — Assessment & Plan Note (Signed)
Improved with Linzess now [employer] as needed Followed by GI

## 2022-05-26 NOTE — Progress Notes (Addendum)
Established Patient Office Visit  Subjective:  Patient ID: Brittany Hanson, female    DOB: Apr 23, 1932  Age: 89 y.o. MRN: [id number]  CC:  Chief Complaint  Patient presents with   Follow-up    Patient is here following up on her Alzheimer Disease     HPI Brittany Hanson is a 89 y.o. female with past medical history of atrial fibrillation, HTN, Alzheimer's dementia, diffuse OA and physical deconditioning who presents for f/u of her chronic medical conditions.  HTN: BP is low today. Takes medications regularly. Patient denies headache, dizziness, chest pain, dyspnea or palpitations.   Atrial fibrillation: She is followed by cardiology in [place].  She is on metoprolol and Eliquis currently.  She does not have any history of cardiac procedure in the past.  Diffuse OA: She has history of OA of knee and hip.  It appears that she has OA of upper extremity as well. She has had PT eval for wheelchair and has a wheelchair. She gets up to use the restroom only with rolling walker, but she is dependent on wheelchair for ambulation at other times.  Alzheimer's dementia: Daughter was concerned about memory decline. She is on donepezil and memantine currently.  She is dependent for her ADLs.  She is able to eat by herself, but daughter has to help prepare the meals, bathing and clothing.  She watches TV during the nighttime and and sleeps during the daytime.  Daughter denies any episodes of agitation, delusion, hallucinations or wandering.   Past Medical History:  Diagnosis Date   Atrial fibrillation ([place])    Dementia ([place])    Hypertension    Osteoarthritis     Past Surgical History:  Procedure Laterality Date   COLONOSCOPY WITH PROPOFOL N/A 08/14/2021   internal/external hemorrhoids   ESOPHAGOGASTRODUODENOSCOPY (EGD) WITH PROPOFOL N/A 08/14/2021   2 cm sliding hiatal hernia otherwise normal   GIVENS CAPSULE STUDY N/A 08/15/2021   Procedure: GIVENS CAPSULE STUDY;  Surgeon: Daneil Dolin, MD;  [facility];  Service: Endoscopy;  Laterality: N/A;    Family History  Problem Relation Age of Onset   Diabetes Sister    Cancer Daughter     Social History   Socioeconomic History   Marital status: Widowed    Spouse name: Not on file   Number of children: 3   Years of education: 6th   Highest education level: Not on file  Occupational History   Not on file  Tobacco Use   Smoking status: Never   Smokeless tobacco: Never  Vaping Use   Vaping Use: Never used  Substance and Sexual Activity   Alcohol use: Never   Drug use: Never   Sexual activity: Not Currently  Other Topics Concern   Not on file  Social History Narrative   Not on file   Social Determinants of Health   Financial Resource Strain: Not on file  Food Insecurity: Not on file  Transportation Needs: Not on file  Physical Activity: Not on file  Stress: Not on file  Social Connections: Not on file  Intimate Partner Violence: Not on file    Outpatient Medications Prior to Visit  Medication Sig Dispense Refill   Acetaminophen (TYLENOL ARTHRITIS PAIN PO) Take 2 tablets by mouth in the morning.     donepezil (ARICEPT) 10 MG tablet TAKE 1 TABLET BY MOUTH EVERYDAY AT BEDTIME 90 tablet 0   ELIQUIS 5 MG TABS tablet TAKE 1 TABLET BY MOUTH TWICE A DAY  60 tablet 0   ferrous sulfate 325 (65 FE) MG tablet Take 325 mg by mouth daily with breakfast.     latanoprost (XALATAN) 0.005 % ophthalmic solution Place 1 drop into both eyes at bedtime.     linaclotide (LINZESS) 72 MCG capsule Take 1 capsule (72 mcg total) by mouth daily before breakfast. 90 capsule 3   memantine (NAMENDA) 5 MG tablet Take 1 tablet (5 mg total) by mouth 2 (two) times daily. 60 tablet 5   simvastatin (ZOCOR) 20 MG tablet TAKE 1 TABLET BY MOUTH EVERYDAY AT BEDTIME 90 tablet 0   metoprolol succinate (TOPROL-XL) 50 MG 24 hr tablet TAKE 1 TABLET BY MOUTH EVERY DAY 90 tablet 0   No facility-administered medications prior to visit.     No Known Allergies  ROS Review of Systems  Constitutional:  Positive for fatigue. Negative for chills and fever.  HENT:  Negative for congestion, sinus pressure, sinus pain and sore throat.   Eyes:  Negative for pain and discharge.  Respiratory:  Negative for cough and shortness of breath.   Cardiovascular:  Negative for chest pain and palpitations.  Gastrointestinal:  Positive for constipation. Negative for abdominal pain, diarrhea, nausea and vomiting.  Endocrine: Negative for polydipsia and polyuria.  Genitourinary:  Negative for dysuria and hematuria.  Musculoskeletal:  Positive for arthralgias, back pain and gait problem. Negative for neck pain and neck stiffness.  Skin:  Negative for rash.  Neurological:  Negative for dizziness and weakness.  Psychiatric/Behavioral:  Positive for decreased concentration and sleep disturbance. Negative for agitation and behavioral problems.       Objective:    Physical Exam Vitals reviewed.  Constitutional:      General: She is not in acute distress.    Appearance: She is not diaphoretic.     Comments: In wheelchair  HENT:     Head: Normocephalic and atraumatic.     Mouth/Throat:     Mouth: Mucous membranes are moist.  Eyes:     General: No scleral icterus.    Extraocular Movements: Extraocular movements intact.  Cardiovascular:     Rate and Rhythm: Normal rate. Rhythm irregular.     Heart sounds: Normal heart sounds. No murmur heard. Pulmonary:     Breath sounds: Normal breath sounds. No wheezing or rales.  Abdominal:     Palpations: Abdomen is soft.     Tenderness: There is no abdominal tenderness.  Musculoskeletal:        General: Swelling (B/l knee) present.     Cervical back: Neck supple. No tenderness.     Right lower leg: No edema.     Left lower leg: No edema.     Comments: ROM limited at b/l knee up to 45 degrees  Skin:    General: Skin is warm.     Findings: No rash.  Neurological:     General: No focal deficit  present.     Mental Status: She is alert. Mental status is at baseline.     Motor: Weakness (RLE and LLE - 2/5, RUE and LUE - 2/5) present.     Gait: Gait abnormal (Non-ambulatory mostly).  Psychiatric:        Mood and Affect: Mood normal.        Behavior: Behavior normal.     BP (!) 77/55 (BP Location: Left Arm, Patient Position: Sitting, Cuff Size: Normal)   Pulse 72   Ht 5' 5"$  (1.651 m)   Wt 154 lb (69.9 kg)  SpO2 96%   BMI 25.63 kg/m  Wt Readings from Last 3 Encounters:  05/26/22 154 lb (69.9 kg)  02/18/22 154 lb (69.9 kg)  02/11/22 154 lb (69.9 kg)    Lab Results  Component Value Date   TSH 2.780 11/20/2021   Lab Results  Component Value Date   WBC 6.1 04/29/2022   HGB 13.2 04/29/2022   HCT 39.7 04/29/2022   MCV 93.0 04/29/2022   PLT 190 04/29/2022   Lab Results  Component Value Date   NA 143 11/20/2021   K 5.1 11/20/2021   CO2 22 11/20/2021   GLUCOSE 111 (H) 11/20/2021   BUN 20 11/20/2021   CREATININE 1.15 (H) 11/20/2021   BILITOT 0.3 11/20/2021   ALKPHOS 99 11/20/2021   AST 11 11/20/2021   ALT 8 11/20/2021   PROT 6.2 11/20/2021   ALBUMIN 4.1 11/20/2021   CALCIUM 9.8 11/20/2021   ANIONGAP 7 08/13/2021   EGFR 46 (L) 11/20/2021   Lab Results  Component Value Date   CHOL 139 11/20/2021   Lab Results  Component Value Date   HDL 60 11/20/2021   Lab Results  Component Value Date   LDLCALC 65 11/20/2021   Lab Results  Component Value Date   TRIG 72 11/20/2021   Lab Results  Component Value Date   CHOLHDL 2.3 11/20/2021   Lab Results  Component Value Date   HGBA1C 5.7 (H) 11/20/2021      Assessment & Plan:   Problem List Items Addressed This Visit       Cardiovascular and Mediastinum   Atrial fibrillation ([place])    Rate-controlled with Metoprolol On Eliquis Followed by Cardiology in [place]      Relevant Medications   metoprolol succinate (TOPROL-XL) 25 MG 24 hr tablet   Other Relevant Orders   TSH   Essential hypertension  - Primary    BP Readings from Last 1 Encounters:  05/26/22 (!) 77/55  Hypotensive with Metoprolol 50 mg QD, decreased dose to 25 mg daily Daughter is unclear if cardiologist has added any recent medicine, advised to call with current medicine list Counseled for compliance with the medications Advised DASH diet      Relevant Medications   metoprolol succinate (TOPROL-XL) 25 MG 24 hr tablet   Other Relevant Orders   TSH     Nervous and Auditory   Dementia ([place])    On donepezil and memantine A&O X 3 currently, but sleeps at daytime and stays awake during nighttime Okay to take melatonin as needed for insomnia No episodes of agitation, but does not talk much with other people than her daughter and son-in-law      Relevant Orders   TSH     Genitourinary   Stage 3a chronic kidney disease ([place])    Last BMP reviewed, GFR ranges between 45-55 Avoid nephrotoxic agents Maintain adequate hydration      Relevant Orders   Basic Metabolic Panel (BMET)   Parathyroid hormone, intact (no Ca)     Other   Constipation    Improved with Linzess now Takes MiraLAX as needed Followed by GI      Insomnia due to other mental disorder    Needs to avoid daytime naps to improve nighttime sleep Melatonin as needed for regulating sleep-wake cycle      Other Visit Diagnoses     Refused influenza vaccine           Meds ordered this encounter  Medications   metoprolol succinate (TOPROL-XL) 25  MG 24 hr tablet    Sig: Take 1 tablet (25 mg total) by mouth daily. Take with or immediately following a meal.    Dispense:  90 tablet    Refill:  0    Dose change    Follow-up: Return in about 4 weeks (around 06/23/2022) for HTN.    Lindell Spar, MD

## 2022-05-26 NOTE — Assessment & Plan Note (Signed)
On donepezil and memantine A&O X 3 currently, but sleeps at daytime and stays awake during nighttime Okay to take melatonin as needed for insomnia No episodes of agitation, but does not talk much with other people than her daughter and son-in-law

## 2022-05-26 NOTE — Assessment & Plan Note (Signed)
Rate-controlled with Metoprolol On Eliquis Followed by Cardiology in [place]

## 2022-05-26 NOTE — Telephone Encounter (Signed)
Called in on patient behalf. Patient is not taking any other meds than ones that provider is aware of

## 2022-05-26 NOTE — Patient Instructions (Signed)
Please take Metoprolol 25 mg once daily instead of 50 mg.  Please continue to take other medications as prescribed.  Please contact us with the details of a new medicine prescribed by other provider.

## 2022-05-26 NOTE — Assessment & Plan Note (Signed)
Last BMP reviewed, GFR ranges between 45-55 Avoid nephrotoxic agents Maintain adequate hydration

## 2022-05-26 NOTE — Assessment & Plan Note (Signed)
Needs to avoid daytime naps to improve nighttime sleep Melatonin as needed for regulating sleep-wake cycle

## 2022-05-26 NOTE — Assessment & Plan Note (Addendum)
BP Readings from Last 1 Encounters:  05/26/22 (!) 77/55   Hypotensive with Metoprolol 50 mg QD, decreased dose to 25 mg daily Daughter is unclear if cardiologist has added any recent medicine, advised to call with current medicine list Counseled for compliance with the medications Advised DASH diet

## 2022-05-28 LAB — BASIC METABOLIC PANEL
BUN/Creatinine Ratio: 20 (ref 12–28)
BUN: 27 mg/dL (ref 8–27)
CO2: 24 mmol/L (ref 20–29)
Calcium: 10 mg/dL (ref 8.7–10.3)
Chloride: 102 mmol/L (ref 96–106)
Creatinine, Ser: 1.38 mg/dL — ABNORMAL HIGH (ref 0.57–1.00)
Glucose: 103 mg/dL — ABNORMAL HIGH (ref 70–99)
Potassium: 5.3 mmol/L — ABNORMAL HIGH (ref 3.5–5.2)
Sodium: 140 mmol/L (ref 134–144)
eGFR: 37 mL/min/{1.73_m2} — ABNORMAL LOW (ref >59)

## 2022-05-28 LAB — TSH: TSH: 2.04 u[IU]/mL (ref 0.450–4.500)

## 2022-05-28 LAB — PARATHYROID HORMONE, INTACT (NO CA): PTH: 44 pg/mL (ref 15–65)

## 2022-06-12 ENCOUNTER — Encounter: Payer: Self-pay | Admitting: Internal Medicine

## 2022-06-14 ENCOUNTER — Other Ambulatory Visit: Payer: Self-pay | Admitting: Internal Medicine

## 2022-06-24 ENCOUNTER — Ambulatory Visit (INDEPENDENT_AMBULATORY_CARE_PROVIDER_SITE_OTHER): Payer: Medicare HMO | Admitting: Internal Medicine

## 2022-06-24 ENCOUNTER — Encounter: Payer: Self-pay | Admitting: Internal Medicine

## 2022-06-24 VITALS — BP 102/55 | HR 72 | Ht 60.0 in | Wt 154.0 lb

## 2022-06-24 DIAGNOSIS — L304 Erythema intertrigo: Secondary | ICD-10-CM

## 2022-06-24 DIAGNOSIS — I739 Peripheral vascular disease, unspecified: Secondary | ICD-10-CM | POA: Diagnosis not present

## 2022-06-24 DIAGNOSIS — I4821 Permanent atrial fibrillation: Secondary | ICD-10-CM

## 2022-06-24 DIAGNOSIS — E782 Mixed hyperlipidemia: Secondary | ICD-10-CM

## 2022-06-24 DIAGNOSIS — G309 Alzheimer's disease, unspecified: Secondary | ICD-10-CM

## 2022-06-24 DIAGNOSIS — R69 Illness, unspecified: Secondary | ICD-10-CM | POA: Diagnosis not present

## 2022-06-24 DIAGNOSIS — I10 Essential (primary) hypertension: Secondary | ICD-10-CM

## 2022-06-24 DIAGNOSIS — F02B0 Dementia in other diseases classified elsewhere, moderate, without behavioral disturbance, psychotic disturbance, mood disturbance, and anxiety: Secondary | ICD-10-CM

## 2022-06-24 MED ORDER — KETOCONAZOLE 2 % EX CREA: 1.0000 | TOPICAL_CREAM | Freq: Every day | CUTANEOUS | 0 refills | Status: DC

## 2022-06-24 MED ORDER — APIXABAN 5 MG PO TABS: 5.0000 mg | ORAL_TABLET | Freq: Two times a day (BID) | ORAL | 11 refills | Status: DC

## 2022-06-24 MED ORDER — METOPROLOL SUCCINATE ER 25 MG PO TB24: 25.0000 mg | ORAL_TABLET | Freq: Every day | ORAL | 1 refills | Status: DC

## 2022-06-24 MED ORDER — DONEPEZIL HCL 10 MG PO TABS: 10.0000 mg | ORAL_TABLET | Freq: Every day | ORAL | 3 refills | Status: DC

## 2022-06-24 MED ORDER — SIMVASTATIN 20 MG PO TABS: 20.0000 mg | ORAL_TABLET | Freq: Every day | ORAL | 3 refills | Status: DC

## 2022-06-24 MED ORDER — MEMANTINE HCL 5 MG PO TABS: 5.0000 mg | ORAL_TABLET | Freq: Two times a day (BID) | ORAL | 5 refills | Status: DC

## 2022-06-24 NOTE — Assessment & Plan Note (Signed)
Rate-controlled with Metoprolol 25 mg QD On Eliquis Followed by Cardiology in [place]

## 2022-06-24 NOTE — Assessment & Plan Note (Signed)
On donepezil and memantine A&O X 3 currently, but sleeps at daytime and stays awake during nighttime Okay to take melatonin as needed for insomnia No episodes of agitation, but does not talk much with other people than her daughter and son-in-law

## 2022-06-24 NOTE — Assessment & Plan Note (Signed)
BP Readings from Last 1 Encounters:  06/24/22 (!) 102/55   Well-controlled now Was hypotensive with Metoprolol 50 mg QD, decreased dose to 25 mg daily Counseled for compliance with the medications Advised DASH diet

## 2022-06-24 NOTE — Assessment & Plan Note (Signed)
On Zocor 

## 2022-06-24 NOTE — Patient Instructions (Signed)
Please continue Metoprolol 25 mg once daily.  Please continue other medications as prescribed.

## 2022-06-24 NOTE — Assessment & Plan Note (Signed)
Weak DPA pulse, has bluish discoloration of the feet Could be ruptures capillaries She does not report claudication symptoms, but her ambulation is limited and she has dementia Would check US arterial study for PAD On Eliquis for A fib

## 2022-06-24 NOTE — Progress Notes (Signed)
Established Patient Office Visit  Subjective:  Patient ID: Brittany Hanson, female    DOB: 09-20-1931  Age: 89 y.o. MRN: [id number]  CC:  Chief Complaint  Patient presents with   Hypertension    Four week follow up. Patient has brown spots under breast. Her feet stay purple at all times    HPI Brittany Hanson is a 89 y.o. female with past medical history of atrial fibrillation, HTN, Alzheimer's dementia, diffuse OA and physical deconditioning who presents for f/u of her chronic medical conditions.  HTN: BP is low-normal today, but better compared to prior.  Her dose of metoprolol was decreased to 25 mg daily pain in the last visit.  Takes medications regularly. Patient denies headache, dizziness, chest pain, dyspnea or palpitations.  Her daughter reports bluish discoloration of her feet, which is chronic.  She has cold feet, but denies any numbness, pain upon walking or tingling.   Atrial fibrillation: She is followed by cardiology in [place].  She is on metoprolol and Eliquis currently.  She does not have any history of cardiac procedure in the past.  Diffuse OA: She has history of OA of knee and hip.  It appears that she has OA of upper extremity as well. She has had PT eval for wheelchair and has a wheelchair. She gets up to use the restroom only with rolling walker, but she is dependent on wheelchair for ambulation at other times.  Alzheimer's dementia: Daughter was concerned about memory decline. She is on donepezil and memantine currently.  She is dependent for her ADLs.  She is able to eat by herself, but daughter has to help prepare the meals, bathing and clothing.  She watches TV during the nighttime and and sleeps during the daytime.  Daughter denies any episodes of agitation, delusion, hallucinations or wandering.  She reports mild discomfort and a rash underneath her breasts for the last 1 week.  Denies any bleeding.  Denies any local warmth.   Past Medical History:   Diagnosis Date   Atrial fibrillation ([place])    Dementia ([place])    Hypertension    Osteoarthritis     Past Surgical History:  Procedure Laterality Date   COLONOSCOPY WITH PROPOFOL N/A 08/14/2021   internal/external hemorrhoids   ESOPHAGOGASTRODUODENOSCOPY (EGD) WITH PROPOFOL N/A 08/14/2021   2 cm sliding hiatal hernia otherwise normal   GIVENS CAPSULE STUDY N/A 08/15/2021   Procedure: GIVENS CAPSULE STUDY;  Surgeon: Daneil Dolin, MD;  [facility];  Service: Endoscopy;  Laterality: N/A;    Family History  Problem Relation Age of Onset   Diabetes Sister    Cancer Daughter     Social History   Socioeconomic History   Marital status: Widowed    Spouse name: Not on file   Number of children: 3   Years of education: 6th   Highest education level: Not on file  Occupational History   Not on file  Tobacco Use   Smoking status: Never   Smokeless tobacco: Never  Vaping Use   Vaping Use: Never used  Substance and Sexual Activity   Alcohol use: Never   Drug use: Never   Sexual activity: Not Currently  Other Topics Concern   Not on file  Social History Narrative   Not on file   Social Determinants of Health   Financial Resource Strain: Not on file  Food Insecurity: Not on file  Transportation Needs: Not on file  Physical Activity: Not on file  Stress: Not on file  Social Connections: Not on file  Intimate Partner Violence: Not on file    Outpatient Medications Prior to Visit  Medication Sig Dispense Refill   Acetaminophen (TYLENOL ARTHRITIS PAIN PO) Take 2 tablets by mouth in the morning.     ferrous sulfate 325 (65 FE) MG tablet Take 325 mg by mouth daily with breakfast.     latanoprost (XALATAN) 0.005 % ophthalmic solution Place 1 drop into both eyes at bedtime.     linaclotide (LINZESS) 72 MCG capsule Take 1 capsule (72 mcg total) by mouth daily before breakfast. 90 capsule 3   donepezil (ARICEPT) 10 MG tablet TAKE 1 TABLET BY MOUTH EVERYDAY AT  BEDTIME 90 tablet 0   ELIQUIS 5 MG TABS tablet TAKE 1 TABLET BY MOUTH TWICE A DAY 60 tablet 0   memantine (NAMENDA) 5 MG tablet Take 1 tablet (5 mg total) by mouth 2 (two) times daily. 60 tablet 5   metoprolol succinate (TOPROL-XL) 25 MG 24 hr tablet Take 1 tablet (25 mg total) by mouth daily. Take with or immediately following a meal. 90 tablet 0   simvastatin (ZOCOR) 20 MG tablet TAKE 1 TABLET BY MOUTH EVERYDAY AT BEDTIME 90 tablet 0   No facility-administered medications prior to visit.    No Known Allergies  ROS Review of Systems  Constitutional:  Positive for fatigue. Negative for chills and fever.  HENT:  Negative for congestion, sinus pressure, sinus pain and sore throat.   Eyes:  Negative for pain and discharge.  Respiratory:  Negative for cough and shortness of breath.   Cardiovascular:  Negative for chest pain and palpitations.  Gastrointestinal:  Positive for constipation. Negative for abdominal pain, diarrhea, nausea and vomiting.  Endocrine: Negative for polydipsia and polyuria.  Genitourinary:  Negative for dysuria and hematuria.  Musculoskeletal:  Positive for arthralgias, back pain and gait problem. Negative for neck pain and neck stiffness.  Skin:  Negative for rash.  Neurological:  Negative for dizziness and weakness.  Psychiatric/Behavioral:  Positive for decreased concentration and sleep disturbance. Negative for agitation and behavioral problems.       Objective:    Physical Exam Vitals reviewed.  Constitutional:      General: She is not in acute distress.    Appearance: She is not diaphoretic.     Comments: In wheelchair  HENT:     Head: Normocephalic and atraumatic.     Mouth/Throat:     Mouth: Mucous membranes are moist.  Eyes:     General: No scleral icterus.    Extraocular Movements: Extraocular movements intact.  Cardiovascular:     Rate and Rhythm: Normal rate. Rhythm irregular.     Heart sounds: Normal heart sounds. No murmur heard.     Comments: DPA pulse  - 1+ b/l Pulmonary:     Breath sounds: Normal breath sounds. No wheezing or rales.  Abdominal:     Palpations: Abdomen is soft.     Tenderness: There is no abdominal tenderness.  Musculoskeletal:        General: Swelling (B/l knee) present.     Cervical back: Neck supple. No tenderness.     Right lower leg: No edema.     Left lower leg: No edema.     Comments: ROM limited at b/l knee up to 45 degrees  Skin:    General: Skin is warm.     Findings: Rash (Erythema underneath breasts) present.     Comments: Bluish discoloration and coldness over  b/l feet, chronic  Neurological:     General: No focal deficit present.     Mental Status: She is alert. Mental status is at baseline.     Motor: Weakness (RLE and LLE - 2/5, RUE and LUE - 2/5) present.     Gait: Gait abnormal (Non-ambulatory mostly).  Psychiatric:        Mood and Affect: Mood normal.        Behavior: Behavior normal.     BP (!) 102/55 (BP Location: Left Arm, Patient Position: Sitting, Cuff Size: Normal)   Pulse 72   Ht 5' (1.524 m)   Wt 154 lb (69.9 kg)   SpO2 96%   BMI 30.08 kg/m  Wt Readings from Last 3 Encounters:  06/24/22 154 lb (69.9 kg)  05/26/22 154 lb (69.9 kg)  02/18/22 154 lb (69.9 kg)    Lab Results  Component Value Date   TSH 2.040 05/26/2022   Lab Results  Component Value Date   WBC 6.1 04/29/2022   HGB 13.2 04/29/2022   HCT 39.7 04/29/2022   MCV 93.0 04/29/2022   PLT 190 04/29/2022   Lab Results  Component Value Date   NA 140 05/26/2022   K 5.3 (H) 05/26/2022   CO2 24 05/26/2022   GLUCOSE 103 (H) 05/26/2022   BUN 27 05/26/2022   CREATININE 1.38 (H) 05/26/2022   BILITOT 0.3 11/20/2021   ALKPHOS 99 11/20/2021   AST 11 11/20/2021   ALT 8 11/20/2021   PROT 6.2 11/20/2021   ALBUMIN 4.1 11/20/2021   CALCIUM 10.0 05/26/2022   ANIONGAP 7 08/13/2021   EGFR 37 (L) 05/26/2022   Lab Results  Component Value Date   CHOL 139 11/20/2021   Lab Results  Component Value  Date   HDL 60 11/20/2021   Lab Results  Component Value Date   LDLCALC 65 11/20/2021   Lab Results  Component Value Date   TRIG 72 11/20/2021   Lab Results  Component Value Date   CHOLHDL 2.3 11/20/2021   Lab Results  Component Value Date   HGBA1C 5.7 (H) 11/20/2021      Assessment & Plan:   Problem List Items Addressed This Visit       Cardiovascular and Mediastinum   Atrial fibrillation ([place])    Rate-controlled with Metoprolol 25 mg QD On Eliquis Followed by Cardiology in [place]      Relevant Medications   metoprolol succinate (TOPROL-XL) 25 MG 24 hr tablet   apixaban (ELIQUIS) 5 MG TABS tablet   simvastatin (ZOCOR) 20 MG tablet   Essential hypertension    BP Readings from Last 1 Encounters:  06/24/22 (!) 102/55  Well-controlled now Was hypotensive with Metoprolol 50 mg QD, decreased dose to 25 mg daily Counseled for compliance with the medications Advised DASH diet      Relevant Medications   metoprolol succinate (TOPROL-XL) 25 MG 24 hr tablet   apixaban (ELIQUIS) 5 MG TABS tablet   simvastatin (ZOCOR) 20 MG tablet   PAD (peripheral artery disease) ([facility]) - Primary    Weak DPA pulse, has bluish discoloration of the feet Could be ruptures capillaries She does not report claudication symptoms, but her ambulation is limited and she has dementia Would check US arterial study for PAD On Eliquis for A fib       Relevant Medications   metoprolol succinate (TOPROL-XL) 25 MG 24 hr tablet   apixaban (ELIQUIS) 5 MG TABS tablet   simvastatin (ZOCOR) 20 MG tablet   Other  Relevant Orders   [place] Lower Ext Art Bilat   Ambulatory referral to Vascular Surgery     Nervous and Auditory   Dementia ([place])    On donepezil and memantine A&O X 3 currently, but sleeps at daytime and stays awake during nighttime Okay to take melatonin as needed for insomnia No episodes of agitation, but does not talk much with other people than her daughter and son-in-law       Relevant Medications   donepezil (ARICEPT) 10 MG tablet   memantine (NAMENDA) 5 MG tablet   Other Visit Diagnoses     Intertrigo       Relevant Medications   ketoconazole (NIZORAL) 2 % cream   Mixed hyperlipidemia       Relevant Medications   metoprolol succinate (TOPROL-XL) 25 MG 24 hr tablet   apixaban (ELIQUIS) 5 MG TABS tablet   simvastatin (ZOCOR) 20 MG tablet       Meds ordered this encounter  Medications   ketoconazole (NIZORAL) 2 % cream    Sig: Apply 1 Application topically daily.    Dispense:  15 g    Refill:  0   metoprolol succinate (TOPROL-XL) 25 MG 24 hr tablet    Sig: Take 1 tablet (25 mg total) by mouth daily. Take with or immediately following a meal.    Dispense:  90 tablet    Refill:  1    Please discontinue 50 mg dose.   donepezil (ARICEPT) 10 MG tablet    Sig: Take 1 tablet (10 mg total) by mouth at bedtime.    Dispense:  90 tablet    Refill:  3   apixaban (ELIQUIS) 5 MG TABS tablet    Sig: Take 1 tablet (5 mg total) by mouth 2 (two) times daily.    Dispense:  60 tablet    Refill:  11   memantine (NAMENDA) 5 MG tablet    Sig: Take 1 tablet (5 mg total) by mouth 2 (two) times daily.    Dispense:  60 tablet    Refill:  5   simvastatin (ZOCOR) 20 MG tablet    Sig: Take 1 tablet (20 mg total) by mouth daily at 6 PM.    Dispense:  90 tablet    Refill:  3    Follow-up: Return in about 5 months (around 11/22/2022) for Annual physical.    Lindell Spar, MD

## 2022-07-15 ENCOUNTER — Other Ambulatory Visit: Payer: Self-pay

## 2022-07-15 DIAGNOSIS — I739 Peripheral vascular disease, unspecified: Secondary | ICD-10-CM

## 2022-07-15 DIAGNOSIS — M7989 Other specified soft tissue disorders: Secondary | ICD-10-CM

## 2022-07-17 ENCOUNTER — Ambulatory Visit (INDEPENDENT_AMBULATORY_CARE_PROVIDER_SITE_OTHER): Payer: Medicare HMO | Admitting: Gastroenterology

## 2022-07-17 ENCOUNTER — Encounter: Payer: Self-pay | Admitting: Gastroenterology

## 2022-07-17 VITALS — BP 114/80 | HR 80 | Temp 97.8°F | Ht 64.0 in | Wt 158.6 lb

## 2022-07-17 DIAGNOSIS — D509 Iron deficiency anemia, unspecified: Secondary | ICD-10-CM

## 2022-07-17 DIAGNOSIS — R634 Abnormal weight loss: Secondary | ICD-10-CM | POA: Diagnosis not present

## 2022-07-17 MED ORDER — LINACLOTIDE 72 MCG PO CAPS: 72.0000 ug | ORAL_CAPSULE | Freq: Every day | ORAL | 3 refills | Status: DC

## 2022-07-17 NOTE — Patient Instructions (Signed)
Please complete blood work today.   Continue Linzess once daily.  We will see you in 6 months!  I enjoyed seeing you again today! At our first visit, I mentioned how I value our relationship and want to provide genuine, compassionate, and quality care. You may receive a survey regarding your visit with me, and I welcome your feedback! Thanks so much for taking the time to complete this. I look forward to seeing you again.   Annitta Needs, PhD, ANP-BC [facility]

## 2022-07-17 NOTE — Progress Notes (Signed)
Gastroenterology Office Note     Primary Care Physician:  Lindell Spar, MD  Primary Gastroenterologist:Dr. Abbey Chatters    Chief Complaint   Chief Complaint  Patient presents with   Follow-up    Patient here today for a follow up visit on constipation. She is taking miralax daily, and Linzess 72 mcg daily, both of theses seem to control the issue of constipation.     History of Present Illness   Brittany Hanson is an 89 y.o. female presenting today in follow-up with a history of dementia, afib on eliquis, chronic constipation, history of GI bleed in April 2023 s/p EGD/colonoscopy but unable to swallow capsule, IDA.   At last visit, we had noted unintentional weight loss. Weight is improved from prior visit. Hgb in December had improved to 13.2, ferritin 39. She is due for recheck now.   Good appetite. No abdominal pain, N/V. Denies overt GI bleeding. Constipation managed with Linzess 72 mcg and Miralax daily. Doesn't believe she is on iron.        Past Medical History:  Diagnosis Date   Atrial fibrillation ([place])    Dementia ([place])    Hypertension    Osteoarthritis     Past Surgical History:  Procedure Laterality Date   COLONOSCOPY WITH PROPOFOL N/A 08/14/2021   internal/external hemorrhoids   ESOPHAGOGASTRODUODENOSCOPY (EGD) WITH PROPOFOL N/A 08/14/2021   2 cm sliding hiatal hernia otherwise normal   GIVENS CAPSULE STUDY N/A 08/15/2021   Procedure: GIVENS CAPSULE STUDY;  Surgeon: Daneil Dolin, MD;  [facility];  Service: Endoscopy;  Laterality: N/A;    Current Outpatient Medications  Medication Sig Dispense Refill   Acetaminophen (TYLENOL ARTHRITIS PAIN PO) Take 2 tablets by mouth in the morning.     apixaban (ELIQUIS) 5 MG TABS tablet Take 1 tablet (5 mg total) by mouth 2 (two) times daily. 60 tablet 11   donepezil (ARICEPT) 10 MG tablet Take 1 tablet (10 mg total) by mouth at bedtime. 90 tablet 3   latanoprost (XALATAN) 0.005 % ophthalmic  solution Place 1 drop into both eyes at bedtime.     linaclotide (LINZESS) 72 MCG capsule Take 1 capsule (72 mcg total) by mouth daily before breakfast. 90 capsule 3   memantine (NAMENDA) 5 MG tablet Take 1 tablet (5 mg total) by mouth 2 (two) times daily. 60 tablet 5   metoprolol succinate (TOPROL-XL) 25 MG 24 hr tablet Take 1 tablet (25 mg total) by mouth daily. Take with or immediately following a meal. 90 tablet 1   polyethylene glycol (MIRALAX / GLYCOLAX) 17 g packet Take 17 g by mouth daily.     simvastatin (ZOCOR) 20 MG tablet Take 1 tablet (20 mg total) by mouth daily at 6 PM. 90 tablet 3   ferrous sulfate 325 (65 FE) MG tablet Take 325 mg by mouth daily with breakfast. (Patient not taking: Reported on 07/17/2022)     No current facility-administered medications for this visit.    Allergies as of 07/17/2022   (No Known Allergies)    Family History  Problem Relation Age of Onset   Diabetes Sister    Cancer Daughter     Social History   Socioeconomic History   Marital status: Widowed    Spouse name: Not on file   Number of children: 3   Years of education: 6th   Highest education level: Not on file  Occupational History   Not on file  Tobacco Use  Smoking status: Never   Smokeless tobacco: Never  Vaping Use   Vaping Use: Never used  Substance and Sexual Activity   Alcohol use: Never   Drug use: Never   Sexual activity: Not Currently  Other Topics Concern   Not on file  Social History Narrative   Not on file   Social Determinants of Health   Financial Resource Strain: Not on file  Food Insecurity: Not on file  Transportation Needs: Not on file  Physical Activity: Not on file  Stress: Not on file  Social Connections: Not on file  Intimate Partner Violence: Not on file     Review of Systems   Gen: Denies any fever, chills, fatigue, weight loss, lack of appetite.  CV: Denies chest pain, heart palpitations, peripheral edema, syncope.  Resp: Denies  shortness of breath at rest or with exertion. Denies wheezing or cough.  GI: Denies dysphagia or odynophagia. Denies jaundice, hematemesis, fecal incontinence. GU : Denies urinary burning, urinary frequency, urinary hesitancy MS: Denies joint pain, muscle weakness, cramps, or limitation of movement.  Derm: Denies rash, itching, dry skin Psych: Denies depression, anxiety, memory loss, and confusion Heme: Denies bruising, bleeding, and enlarged lymph nodes.   Physical Exam   BP 114/80 (BP Location: Left Arm, Patient Position: Sitting, Cuff Size: Large)   Pulse 80   Temp 97.8 F (36.6 C) (Temporal)   Ht '5\' 4"'$  (1.626 m)   Wt 158 lb 9.6 oz (71.9 kg)   BMI 27.22 kg/m  General:   Alert and oriented. Pleasant and cooperative. Well-nourished and well-developed.  Head:  Normocephalic and atraumatic. Eyes:  Without icterus Abdomen:  +BS, soft, non-tender and non-distended. No HSM noted. No guarding or rebound. No masses appreciated.  Rectal:  Deferred  Msk:  Symmetrical without gross deformities. Normal posture. Extremities:  With chronic venous statis changes, varicosities  Neurologic:  Alert and  oriented x4;  grossly normal neurologically. Skin:  Intact without significant lesions or rashes. Psych:  Alert and cooperative. Normal mood and affect.   Assessment   Brittany Hanson is an 89 y.o. female presenting today in follow-up with a history of dementia, afib on eliquis, chronic constipation, history of GI bleed in April 2023 s/p EGD/colonoscopy but unable to swallow capsule, IDA, weight loss.  Weight loss: improved. No concerning signs.  IDA: will update CBC and iron studies. If drops in hgb or worsening ferritin, would recommend capsule study to conclude eval.  Constipation: continue Linzess 72 mcg daily and Miralax.     PLAN    CBC, iron studies today Linzess 72 mcg and Miralax daily Follow-up 6 months   Annitta Needs, PhD, [facility] Gastroenterology

## 2022-07-18 LAB — CBC WITH DIFFERENTIAL/PLATELET
Basophils Absolute: 0.1 10*3/uL (ref 0.0–0.2)
Basos: 1 %
EOS (ABSOLUTE): 0.2 10*3/uL (ref 0.0–0.4)
Eos: 3 %
Hematocrit: 42.7 % (ref 34.0–46.6)
Hemoglobin: 13.6 g/dL (ref 11.1–15.9)
Immature Grans (Abs): 0 10*3/uL (ref 0.0–0.1)
Immature Granulocytes: 0 %
Lymphocytes Absolute: 0.9 10*3/uL (ref 0.7–3.1)
Lymphs: 16 %
MCH: 29.6 pg (ref 26.6–33.0)
MCHC: 31.9 g/dL (ref 31.5–35.7)
MCV: 93 fL (ref 79–97)
Monocytes Absolute: 0.5 10*3/uL (ref 0.1–0.9)
Monocytes: 9 %
Neutrophils Absolute: 3.9 10*3/uL (ref 1.4–7.0)
Neutrophils: 71 %
Platelets: 203 10*3/uL (ref 150–450)
RBC: 4.6 x10E6/uL (ref 3.77–5.28)
RDW: 13.1 % (ref 11.7–15.4)
WBC: 5.5 10*3/uL (ref 3.4–10.8)

## 2022-07-18 LAB — IRON,TIBC AND FERRITIN PANEL
Ferritin: 53 ng/mL (ref 15–150)
Iron Saturation: 29 % (ref 15–55)
Iron: 93 ug/dL (ref 27–139)
Total Iron Binding Capacity: 320 ug/dL (ref 250–450)
UIBC: 227 ug/dL (ref 118–369)

## 2022-07-23 ENCOUNTER — Ambulatory Visit: Payer: Medicare HMO | Admitting: Vascular Surgery

## 2022-07-23 ENCOUNTER — Encounter: Payer: Self-pay | Admitting: Vascular Surgery

## 2022-07-23 ENCOUNTER — Ambulatory Visit (INDEPENDENT_AMBULATORY_CARE_PROVIDER_SITE_OTHER): Payer: Medicare HMO

## 2022-07-23 VITALS — BP 109/65 | HR 69 | Temp 97.0°F | Ht 64.0 in | Wt 158.0 lb

## 2022-07-23 DIAGNOSIS — M7989 Other specified soft tissue disorders: Secondary | ICD-10-CM | POA: Diagnosis not present

## 2022-07-23 DIAGNOSIS — I739 Peripheral vascular disease, unspecified: Secondary | ICD-10-CM

## 2022-07-23 LAB — VAS US ABI WITH/WO TBI
Left ABI: 1.06
Right ABI: 1.07

## 2022-07-23 NOTE — Progress Notes (Signed)
V[facility] and [facility]ist of [place]  Patient name: Brittany Hanson MRN: [id number] DOB: 10-30-1931 Sex: female  REASON FOR CONSULT: Evaluation lower extremity discoloration, rule out arterial insufficiency  HPI: Brittany Hanson is a 89 y.o. female, who is here today for evaluation of lower extremity discoloration.  She is here with her daughter who gives a great deal of her history.  She reports that her mother is mostly in a wheelchair and recliner at home.  Does have history of dementia as well.  She does not have any history of arterial rest pain or claudication.  She does have a long history of bluish discoloration of both lower extremities and also telangiectasia on both lower extremities.  She has no history of DVT.  She does not have a history of bleeding from her varicosities.  Past Medical History:  Diagnosis Date   Atrial fibrillation ([place])    Dementia ([place])    Hypertension    Osteoarthritis     Family History  Problem Relation Age of Onset   Diabetes Sister    Cancer Daughter     SOCIAL HISTORY: Social History   Socioeconomic History   Marital status: Widowed    Spouse name: Not on file   Number of children: 3   Years of education: 6th   Highest education level: Not on file  Occupational History   Not on file  Tobacco Use   Smoking status: Never   Smokeless tobacco: Never  Vaping Use   Vaping Use: Never used  Substance and Sexual Activity   Alcohol use: Never   Drug use: Never   Sexual activity: Not Currently  Other Topics Concern   Not on file  Social History Narrative   Not on file   Social Determinants of Health   Financial Resource Strain: Not on file  Food Insecurity: Not on file  Transportation Needs: Not on file  Physical Activity: Not on file  Stress: Not on file  Social Connections: Not on file  Intimate Partner Violence: Not on file    No Known Allergies  Current Outpatient Medications   Medication Sig Dispense Refill   Acetaminophen (TYLENOL ARTHRITIS PAIN PO) Take 2 tablets by mouth in the morning.     apixaban (ELIQUIS) 5 MG TABS tablet Take 1 tablet (5 mg total) by mouth 2 (two) times daily. 60 tablet 11   donepezil (ARICEPT) 10 MG tablet Take 1 tablet (10 mg total) by mouth at bedtime. 90 tablet 3   latanoprost (XALATAN) 0.005 % ophthalmic solution Place 1 drop into both eyes at bedtime.     linaclotide (LINZESS) 72 MCG capsule Take 1 capsule (72 mcg total) by mouth daily before breakfast. 90 capsule 3   memantine (NAMENDA) 5 MG tablet Take 1 tablet (5 mg total) by mouth 2 (two) times daily. 60 tablet 5   metoprolol succinate (TOPROL-XL) 25 MG 24 hr tablet Take 1 tablet (25 mg total) by mouth daily. Take with or immediately following a meal. 90 tablet 1   polyethylene glycol (MIRALAX / GLYCOLAX) 17 g packet Take 17 g by mouth daily.     simvastatin (ZOCOR) 20 MG tablet Take 1 tablet (20 mg total) by mouth daily at 6 PM. 90 tablet 3   ferrous sulfate 325 (65 FE) MG tablet Take 325 mg by mouth daily with breakfast. (Patient not taking: Reported on 07/17/2022)     No current facility-administered medications for this visit.    REVIEW OF SYSTEMS:  [  X] denotes positive finding, '[ ]'$  denotes negative finding Cardiac  Comments:  Chest pain or chest pressure:    Shortness of breath upon exertion:    Short of breath when lying flat:    Irregular heart rhythm:        Vascular    Pain in calf, thigh, or hip brought on by ambulation:    Pain in feet at night that wakes you up from your sleep:     Blood clot in your veins:    Leg swelling:         Pulmonary    Oxygen at home:    Productive cough:     Wheezing:         Neurologic    Sudden weakness in arms or legs:     Sudden numbness in arms or legs:     Sudden onset of difficulty speaking or slurred speech:    Temporary loss of vision in one eye:     Problems with dizziness:         Gastrointestinal    Blood in  stool:     Vomited blood:         Genitourinary    Burning when urinating:     Blood in urine:        Psychiatric    Major depression:         Hematologic    Bleeding problems:    Problems with blood clotting too easily:        Skin    Rashes or ulcers:        Constitutional    Fever or chills:      PHYSICAL EXAM: Vitals:   07/23/22 1037  BP: 109/65  Pulse: 69  Temp: (!) 97 F (36.1 C)  SpO2: 97%  Weight: 158 lb (71.7 kg)  Height: '5\' 4"'$  (1.626 m)    GENERAL: The patient is a well-nourished female, in no acute distress. The vital signs are documented above. CARDIOVASCULAR: 2+ radial pulses bilaterally.  2+ popliteal and 2+ dorsalis pedis pulses bilaterally.  She does have marked cyanotic discoloration of both lower extremities and feet.  She does have telangiectasia and raised telangiectasia on her posterior calves bilaterally PULMONARY: There is good air exchange  MUSCULOSKELETAL: There are no major deformities or cyanosis. NEUROLOGIC: No focal weakness or paresthesias are detected. SKIN: There are no ulcers or rashes noted. PSYCHIATRIC: The patient has a normal affect.  DATA:  Lower extremity noninvasive arterial studies today reveal normal ankle arm index and normal triphasic waveforms bilaterally.  MEDICAL ISSUES: I discussed these findings with patient and her daughter.  Explained that she has normal arterial flow.  I did explain that she has significant venous hypertension.  This does not require any specific treatment.  I did explain that the raised telangiectasia that she has on her distal calf can occasionally bleed significantly.  I described the first-aid treatment at home with pressure and Ace wrap should this occur.  She will see [place] again on an as-needed basis   Rosetta Posner, MD [facility] V[facility] Specialists of [facility] Tel [phone] Pager [phone]  Note: Portions of this report may have been transcribed using voice  recognition software.  Every effort has been made to ensure accuracy; however, inadvertent computerized transcription errors may still be present.

## 2022-07-29 DIAGNOSIS — Z809 Family history of malignant neoplasm, unspecified: Secondary | ICD-10-CM | POA: Diagnosis not present

## 2022-07-29 DIAGNOSIS — D6869 Other thrombophilia: Secondary | ICD-10-CM | POA: Diagnosis not present

## 2022-07-29 DIAGNOSIS — Z823 Family history of stroke: Secondary | ICD-10-CM | POA: Diagnosis not present

## 2022-07-29 DIAGNOSIS — Z7901 Long term (current) use of anticoagulants: Secondary | ICD-10-CM | POA: Diagnosis not present

## 2022-07-29 DIAGNOSIS — I129 Hypertensive chronic kidney disease with stage 1 through stage 4 chronic kidney disease, or unspecified chronic kidney disease: Secondary | ICD-10-CM | POA: Diagnosis not present

## 2022-07-29 DIAGNOSIS — I4891 Unspecified atrial fibrillation: Secondary | ICD-10-CM | POA: Diagnosis not present

## 2022-07-29 DIAGNOSIS — G309 Alzheimer's disease, unspecified: Secondary | ICD-10-CM | POA: Diagnosis not present

## 2022-07-29 DIAGNOSIS — K5900 Constipation, unspecified: Secondary | ICD-10-CM | POA: Diagnosis not present

## 2022-07-29 DIAGNOSIS — N1832 Chronic kidney disease, stage 3b: Secondary | ICD-10-CM | POA: Diagnosis not present

## 2022-07-29 DIAGNOSIS — F0280 Dementia in other diseases classified elsewhere without behavioral disturbance: Secondary | ICD-10-CM | POA: Diagnosis not present

## 2022-07-29 DIAGNOSIS — E785 Hyperlipidemia, unspecified: Secondary | ICD-10-CM | POA: Diagnosis not present

## 2022-07-29 DIAGNOSIS — Z5982 Transportation insecurity: Secondary | ICD-10-CM | POA: Diagnosis not present

## 2022-09-08 ENCOUNTER — Telehealth: Payer: Self-pay | Admitting: Internal Medicine

## 2022-09-08 NOTE — Telephone Encounter (Signed)
Called patient to schedule Medicare Annual Wellness Visit (AWV). Left message for patient to call back and schedule Medicare Annual Wellness Visit (AWV).  Last date of AWV: 06/11/2021   Please schedule an appointment at any time with Abby, NHA. .  If any questions, please contact me at [phone].  Thank you,  Judeth Cornfield,  AMB Clinical Support [facility] AWV Program Direct Dial ??[phone]

## 2022-09-18 ENCOUNTER — Other Ambulatory Visit: Payer: Self-pay | Admitting: Internal Medicine

## 2022-11-06 DIAGNOSIS — H401122 Primary open-angle glaucoma, left eye, moderate stage: Secondary | ICD-10-CM | POA: Diagnosis not present

## 2022-11-06 DIAGNOSIS — H401111 Primary open-angle glaucoma, right eye, mild stage: Secondary | ICD-10-CM | POA: Diagnosis not present

## 2022-11-06 DIAGNOSIS — D3132 Benign neoplasm of left choroid: Secondary | ICD-10-CM | POA: Diagnosis not present

## 2022-11-06 DIAGNOSIS — H25813 Combined forms of age-related cataract, bilateral: Secondary | ICD-10-CM | POA: Diagnosis not present

## 2022-11-24 ENCOUNTER — Encounter: Payer: Self-pay | Admitting: Internal Medicine

## 2022-11-24 ENCOUNTER — Ambulatory Visit (INDEPENDENT_AMBULATORY_CARE_PROVIDER_SITE_OTHER): Payer: Medicare HMO | Admitting: Internal Medicine

## 2022-11-24 VITALS — BP 94/61 | HR 64 | Ht 64.0 in

## 2022-11-24 DIAGNOSIS — F02B0 Dementia in other diseases classified elsewhere, moderate, without behavioral disturbance, psychotic disturbance, mood disturbance, and anxiety: Secondary | ICD-10-CM

## 2022-11-24 DIAGNOSIS — I10 Essential (primary) hypertension: Secondary | ICD-10-CM | POA: Diagnosis not present

## 2022-11-24 DIAGNOSIS — I739 Peripheral vascular disease, unspecified: Secondary | ICD-10-CM | POA: Diagnosis not present

## 2022-11-24 DIAGNOSIS — Z0001 Encounter for general adult medical examination with abnormal findings: Secondary | ICD-10-CM | POA: Diagnosis not present

## 2022-11-24 DIAGNOSIS — E782 Mixed hyperlipidemia: Secondary | ICD-10-CM

## 2022-11-24 DIAGNOSIS — I4821 Permanent atrial fibrillation: Secondary | ICD-10-CM

## 2022-11-24 DIAGNOSIS — G309 Alzheimer's disease, unspecified: Secondary | ICD-10-CM

## 2022-11-24 DIAGNOSIS — N1831 Chronic kidney disease, stage 3a: Secondary | ICD-10-CM

## 2022-11-24 DIAGNOSIS — R7303 Prediabetes: Secondary | ICD-10-CM

## 2022-11-24 NOTE — Progress Notes (Signed)
Established Patient Office Visit  Subjective:  Patient ID: Brittany Hanson, female    DOB: 17-Apr-1932  Age: 89 y.o. MRN: [id number]  CC:  Chief Complaint  Patient presents with   Annual Exam   Dementia    Patient daughter states she feels patient dementia has gotten worse    HPI Brittany Hanson is a 89 y.o. female with past medical history of atrial fibrillation, HTN, Alzheimer's dementia, diffuse OA and physical deconditioning who presents for annual physical.  HTN: BP is low normal, but stable compared to prior. Takes metoprolol 25 mg (half tablet of 50 mg) QD regularly for A-fib. Patient denies headache, dizziness, chest pain, dyspnea or palpitations.  Atrial fibrillation: She is followed by cardiology in [place].  She is on metoprolol and Eliquis currently.  She does not have any history of cardiac procedure in the past.  Alzheimer's dementia: She is on donepezil and memantine currently.  She is dependent for most of her ADLs.  She is able to eat by herself, but daughter has to help prepare the meals, bathing and clothing.  She watches TV during the nighttime and and sleeps during the daytime.  Daughter denies any episodes of agitation, delusion, hallucinations or wandering.  Daughter reports that she has spells of worsening of dementia, that she repeats the same questions, but denies any episode of agitation, delusion or hallucinations.  Constipation has improved with Linzess now.  Denies any melena or hematochezia now.     Past Medical History:  Diagnosis Date   Atrial fibrillation (HCC)    Dementia (HCC)    Hypertension    Osteoarthritis     Past Surgical History:  Procedure Laterality Date   COLONOSCOPY WITH PROPOFOL N/A 08/14/2021   internal/external hemorrhoids   ESOPHAGOGASTRODUODENOSCOPY (EGD) WITH PROPOFOL N/A 08/14/2021   2 cm sliding hiatal hernia otherwise normal   GIVENS CAPSULE STUDY N/A 08/15/2021   Procedure: GIVENS CAPSULE STUDY;  Surgeon: Corbin Ade, MD;  [facility];  Service: Endoscopy;  Laterality: N/A;    Family History  Problem Relation Age of Onset   Diabetes Sister    Cancer Daughter     Social History   Socioeconomic History   Marital status: Widowed    Spouse name: Not on file   Number of children: 3   Years of education: 6th   Highest education level: Not on file  Occupational History   Not on file  Tobacco Use   Smoking status: Never   Smokeless tobacco: Never  Vaping Use   Vaping status: Never Used  Substance and Sexual Activity   Alcohol use: Never   Drug use: Never   Sexual activity: Not Currently  Other Topics Concern   Not on file  Social History Narrative   Not on file   Social Determinants of Health   Financial Resource Strain: Not on file  Food Insecurity: Not on file  Transportation Needs: Not on file  Physical Activity: Not on file  Stress: Not on file  Social Connections: Not on file  Intimate Partner Violence: Not on file    Outpatient Medications Prior to Visit  Medication Sig Dispense Refill   Acetaminophen (TYLENOL ARTHRITIS PAIN PO) Take 2 tablets by mouth in the morning.     apixaban (ELIQUIS) 5 MG TABS tablet Take 1 tablet (5 mg total) by mouth 2 (two) times daily. 60 tablet 11   donepezil (ARICEPT) 10 MG tablet Take 1 tablet (10 mg total) by mouth  at bedtime. 90 tablet 3   ferrous sulfate 325 (65 FE) MG tablet Take 325 mg by mouth daily with breakfast. (Patient not taking: Reported on 07/17/2022)     latanoprost (XALATAN) 0.005 % ophthalmic solution Place 1 drop into both eyes at bedtime.     linaclotide (LINZESS) 72 MCG capsule Take 1 capsule (72 mcg total) by mouth daily before breakfast. 90 capsule 3   memantine (NAMENDA) 5 MG tablet Take 1 tablet (5 mg total) by mouth 2 (two) times daily. 60 tablet 5   metoprolol succinate (TOPROL-XL) 25 MG 24 hr tablet Take 1 tablet (25 mg total) by mouth daily. Take with or immediately following a meal. 90 tablet 1    polyethylene glycol (MIRALAX / GLYCOLAX) 17 g packet Take 17 g by mouth daily.     simvastatin (ZOCOR) 20 MG tablet Take 1 tablet (20 mg total) by mouth daily at 6 PM. 90 tablet 3   metoprolol succinate (TOPROL-XL) 50 MG 24 hr tablet TAKE 1 TABLET BY MOUTH EVERY DAY 90 tablet 0   No facility-administered medications prior to visit.    No Known Allergies  ROS Review of Systems  Constitutional:  Positive for fatigue. Negative for chills and fever.  HENT:  Negative for congestion, sinus pressure, sinus pain and sore throat.   Eyes:  Negative for pain and discharge.  Respiratory:  Negative for cough and shortness of breath.   Cardiovascular:  Negative for chest pain and palpitations.  Gastrointestinal:  Positive for constipation. Negative for abdominal pain, diarrhea, nausea and vomiting.  Endocrine: Negative for polydipsia and polyuria.  Genitourinary:  Negative for dysuria and hematuria.  Musculoskeletal:  Positive for arthralgias, back pain and gait problem. Negative for neck pain and neck stiffness.  Skin:  Negative for rash.  Neurological:  Negative for dizziness and weakness.  Psychiatric/Behavioral:  Positive for decreased concentration and sleep disturbance. Negative for agitation and behavioral problems.       Objective:    Physical Exam Vitals reviewed.  Constitutional:      General: She is not in acute distress.    Appearance: She is not diaphoretic.     Comments: In wheelchair  HENT:     Head: Normocephalic and atraumatic.     Mouth/Throat:     Mouth: Mucous membranes are moist.  Eyes:     General: No scleral icterus.    Extraocular Movements: Extraocular movements intact.  Cardiovascular:     Rate and Rhythm: Normal rate. Rhythm irregular.     Pulses: Normal pulses.     Heart sounds: Normal heart sounds. No murmur heard. Pulmonary:     Breath sounds: Normal breath sounds. No wheezing or rales.  Abdominal:     Palpations: Abdomen is soft.     Tenderness: There  is no abdominal tenderness.  Musculoskeletal:        General: Swelling (B/l knee) present.     Cervical back: Neck supple. No tenderness.     Right lower leg: Edema (1+) present.     Left lower leg: Edema (1+) present.     Comments: ROM limited at b/l knee up to 45 degrees  Skin:    General: Skin is warm.     Findings: No rash.  Neurological:     General: No focal deficit present.     Mental Status: She is alert. Mental status is at baseline.     Motor: Weakness (RLE and LLE - 2/5, RUE and LUE - 3/5) present.  Gait: Gait abnormal (Non-ambulatory mostly).  Psychiatric:        Mood and Affect: Mood normal.        Behavior: Behavior normal.     BP 94/61 (BP Location: Left Arm, Patient Position: Sitting, Cuff Size: Normal)   Pulse 64   Ht 5\' 4"  (1.626 m)   SpO2 96%   BMI 27.12 kg/m  Wt Readings from Last 3 Encounters:  07/23/22 158 lb (71.7 kg)  07/17/22 158 lb 9.6 oz (71.9 kg)  06/24/22 154 lb (69.9 kg)    Lab Results  Component Value Date   TSH 2.040 05/26/2022   Lab Results  Component Value Date   WBC 5.5 07/17/2022   HGB 13.6 07/17/2022   HCT 42.7 07/17/2022   MCV 93 07/17/2022   PLT 203 07/17/2022   Lab Results  Component Value Date   NA 140 05/26/2022   K 5.3 (H) 05/26/2022   CO2 24 05/26/2022   GLUCOSE 103 (H) 05/26/2022   BUN 27 05/26/2022   CREATININE 1.38 (H) 05/26/2022   BILITOT 0.3 11/20/2021   ALKPHOS 99 11/20/2021   AST 11 11/20/2021   ALT 8 11/20/2021   PROT 6.2 11/20/2021   ALBUMIN 4.1 11/20/2021   CALCIUM 10.0 05/26/2022   ANIONGAP 7 08/13/2021   EGFR 37 (L) 05/26/2022   Lab Results  Component Value Date   CHOL 139 11/20/2021   Lab Results  Component Value Date   HDL 60 11/20/2021   Lab Results  Component Value Date   LDLCALC 65 11/20/2021   Lab Results  Component Value Date   TRIG 72 11/20/2021   Lab Results  Component Value Date   CHOLHDL 2.3 11/20/2021   Lab Results  Component Value Date   HGBA1C 5.7 (H)  11/20/2021      Assessment & Plan:   Problem List Items Addressed This Visit       Cardiovascular and Mediastinum   Atrial fibrillation (HCC)    Rate-controlled with Metoprolol 25 mg every day, needs to clarify with pharmacy to avoid wrong dosage On Eliquis Followed by Cardiology in [place]      Essential hypertension    BP Readings from Last 1 Encounters:  11/24/22 94/61   Well-controlled now Was hypotensive with Metoprolol 50 mg QD, decreased dose to 25 mg daily -if persistent hypotension or symptomatic, will have to discontinue metoprolol Counseled for compliance with the medications Advised DASH diet      PAD (peripheral artery disease) (HCC)    Weak DPA pulse, has bluish discoloration of the feet Could be ruptured capillaries She does not report claudication symptoms, but her ambulation is limited and she has dementia Had ordered US arterial study for PAD, but could not get it done Plan for [place] ABI screening On Eliquis for A fib        Nervous and Auditory   Dementia (HCC)    On donepezil and memantine A&O X 3 currently, but sleeps at daytime and stays awake during nighttime Okay to take melatonin as needed for insomnia No episodes of agitation, but does not talk much with other people than her daughter and son-in-law      Relevant Orders   TSH   CMP14+EGFR   CBC with Differential/Platelet     Genitourinary   Stage 3a chronic kidney disease (HCC)    Last BMP reviewed, GFR ranges between 45-55 Avoid nephrotoxic agents Maintain adequate hydration      Relevant Orders   VITAMIN D 25 Hydroxy (  Vit-D Deficiency, Fractures)   CMP14+EGFR     Other   Encounter for general adult medical examination with abnormal findings - Primary    Physical exam as documented. Fasting blood tests today. Advised to get Shingrix and Tdap vaccine at local pharmacy. Denies DEXA scan.      Mixed hyperlipidemia    On Zocor      Relevant Orders   Lipid panel   Other  Visit Diagnoses     Prediabetes       Relevant Orders   Hemoglobin A1c       No orders of the defined types were placed in this encounter.   Follow-up: Return in about 6 months (around 05/27/2023) for HTN and dementia.    Anabel Halon, MD

## 2022-11-24 NOTE — Patient Instructions (Addendum)
Please take Metoprolol 25 mg once daily.  Please continue to take medications as prescribed.  Please continue to follow low salt diet and ambulate as tolerated.  Please consider getting Shingrix and Tdap vaccine at local pharmacy.

## 2022-11-24 NOTE — Assessment & Plan Note (Signed)
Last BMP reviewed, GFR ranges between 45-55 Avoid nephrotoxic agents Maintain adequate hydration

## 2022-11-24 NOTE — Assessment & Plan Note (Signed)
Weak DPA pulse, has bluish discoloration of the feet Could be ruptured capillaries She does not report claudication symptoms, but her ambulation is limited and she has dementia Had ordered US arterial study for PAD, but could not get it done Plan for [place] ABI screening On Eliquis for A fib

## 2022-11-24 NOTE — Assessment & Plan Note (Signed)
Rate-controlled with Metoprolol 25 mg every day, needs to clarify with pharmacy to avoid wrong dosage On Eliquis Followed by Cardiology in [place]

## 2022-11-24 NOTE — Assessment & Plan Note (Signed)
 On Zocor 

## 2022-11-24 NOTE — Assessment & Plan Note (Signed)
On donepezil and memantine A&O X 3 currently, but sleeps at daytime and stays awake during nighttime Okay to take melatonin as needed for insomnia No episodes of agitation, but does not talk much with other people than her daughter and son-in-law 

## 2022-11-24 NOTE — Assessment & Plan Note (Signed)
Physical exam as documented. Fasting blood tests today. Advised to get Shingrix and Tdap vaccine at local pharmacy. Denies DEXA scan.

## 2022-11-24 NOTE — Assessment & Plan Note (Addendum)
BP Readings from Last 1 Encounters:  11/24/22 94/61   Well-controlled now Was hypotensive with Metoprolol 50 mg QD, decreased dose to 25 mg daily -if persistent hypotension or symptomatic, will have to discontinue metoprolol Counseled for compliance with the medications Advised DASH diet

## 2022-11-25 LAB — CMP14+EGFR
ALT: 10 IU/L (ref 0–32)
AST: 19 IU/L (ref 0–40)
Albumin: 4 g/dL (ref 3.7–4.7)
Alkaline Phosphatase: 91 IU/L (ref 44–121)
BUN/Creatinine Ratio: 17 (ref 12–28)
BUN: 22 mg/dL (ref 8–27)
Bilirubin Total: 0.4 mg/dL (ref 0.0–1.2)
CO2: 24 mmol/L (ref 20–29)
Calcium: 9.8 mg/dL (ref 8.7–10.3)
Chloride: 104 mmol/L (ref 96–106)
Creatinine, Ser: 1.28 mg/dL — ABNORMAL HIGH (ref 0.57–1.00)
Globulin, Total: 2.1 g/dL (ref 1.5–4.5)
Glucose: 100 mg/dL — ABNORMAL HIGH (ref 70–99)
Potassium: 4.6 mmol/L (ref 3.5–5.2)
Sodium: 141 mmol/L (ref 134–144)
Total Protein: 6.1 g/dL (ref 6.0–8.5)
eGFR: 40 mL/min/{1.73_m2} — ABNORMAL LOW (ref >59)

## 2022-11-25 LAB — CBC WITH DIFFERENTIAL/PLATELET
Basophils Absolute: 0.1 10*3/uL (ref 0.0–0.2)
Basos: 1 %
EOS (ABSOLUTE): 0.1 10*3/uL (ref 0.0–0.4)
Eos: 3 %
Hematocrit: 40.1 % (ref 34.0–46.6)
Hemoglobin: 13 g/dL (ref 11.1–15.9)
Immature Grans (Abs): 0 10*3/uL (ref 0.0–0.1)
Immature Granulocytes: 0 %
Lymphocytes Absolute: 0.8 10*3/uL (ref 0.7–3.1)
Lymphs: 15 %
MCH: 30.6 pg (ref 26.6–33.0)
MCHC: 32.4 g/dL (ref 31.5–35.7)
MCV: 94 fL (ref 79–97)
Monocytes Absolute: 0.6 10*3/uL (ref 0.1–0.9)
Monocytes: 11 %
Neutrophils Absolute: 3.9 10*3/uL (ref 1.4–7.0)
Neutrophils: 70 %
Platelets: 189 10*3/uL (ref 150–450)
RBC: 4.25 x10E6/uL (ref 3.77–5.28)
RDW: 13 % (ref 11.7–15.4)
WBC: 5.5 10*3/uL (ref 3.4–10.8)

## 2022-11-25 LAB — HEMOGLOBIN A1C
Est. average glucose Bld gHb Est-mCnc: 114 mg/dL
Hgb A1c MFr Bld: 5.6 % (ref 4.8–5.6)

## 2022-11-25 LAB — LIPID PANEL
Chol/HDL Ratio: 2.5 ratio (ref 0.0–4.4)
Cholesterol, Total: 149 mg/dL (ref 100–199)
HDL: 60 mg/dL (ref >39)
LDL Chol Calc (NIH): 74 mg/dL (ref 0–99)
Triglycerides: 77 mg/dL (ref 0–149)
VLDL Cholesterol Cal: 15 mg/dL (ref 5–40)

## 2022-11-25 LAB — VITAMIN D 25 HYDROXY (VIT D DEFICIENCY, FRACTURES): Vit D, 25-Hydroxy: 43.1 ng/mL (ref 30.0–100.0)

## 2022-11-25 LAB — TSH: TSH: 2.57 u[IU]/mL (ref 0.450–4.500)

## 2022-12-27 ENCOUNTER — Other Ambulatory Visit: Payer: Self-pay | Admitting: Internal Medicine

## 2022-12-27 DIAGNOSIS — L304 Erythema intertrigo: Secondary | ICD-10-CM

## 2023-02-18 ENCOUNTER — Other Ambulatory Visit: Payer: Self-pay | Admitting: Internal Medicine

## 2023-02-18 DIAGNOSIS — L304 Erythema intertrigo: Secondary | ICD-10-CM

## 2023-03-05 DIAGNOSIS — I482 Chronic atrial fibrillation, unspecified: Secondary | ICD-10-CM | POA: Diagnosis not present

## 2023-03-05 DIAGNOSIS — Z7901 Long term (current) use of anticoagulants: Secondary | ICD-10-CM | POA: Diagnosis not present

## 2023-03-05 DIAGNOSIS — I10 Essential (primary) hypertension: Secondary | ICD-10-CM | POA: Diagnosis not present

## 2023-03-05 DIAGNOSIS — E785 Hyperlipidemia, unspecified: Secondary | ICD-10-CM | POA: Diagnosis not present

## 2023-03-20 ENCOUNTER — Other Ambulatory Visit: Payer: Self-pay | Admitting: Internal Medicine

## 2023-03-20 DIAGNOSIS — L304 Erythema intertrigo: Secondary | ICD-10-CM

## 2023-04-14 DIAGNOSIS — R69 Illness, unspecified: Secondary | ICD-10-CM | POA: Diagnosis not present

## 2023-04-16 ENCOUNTER — Other Ambulatory Visit: Payer: Self-pay | Admitting: Internal Medicine

## 2023-04-16 DIAGNOSIS — F02B0 Dementia in other diseases classified elsewhere, moderate, without behavioral disturbance, psychotic disturbance, mood disturbance, and anxiety: Secondary | ICD-10-CM

## 2023-04-24 ENCOUNTER — Other Ambulatory Visit: Payer: Self-pay | Admitting: Internal Medicine

## 2023-04-24 DIAGNOSIS — L304 Erythema intertrigo: Secondary | ICD-10-CM

## 2023-05-04 IMAGING — CT CT ABD-PELV W/O CM
2 of 7 series · 11 of 46 positions shown, 12 images · non-contrast
Comparison: None.

CLINICAL DATA: 89-year-old female with rectal bleeding. Dark
stools.



[Series 2: axial st · axial · 0.81mm/px · z∈[+942,+1267]mm · 8 of 77 slices shown, 9 images]
[im 6/77  soft-tissue]
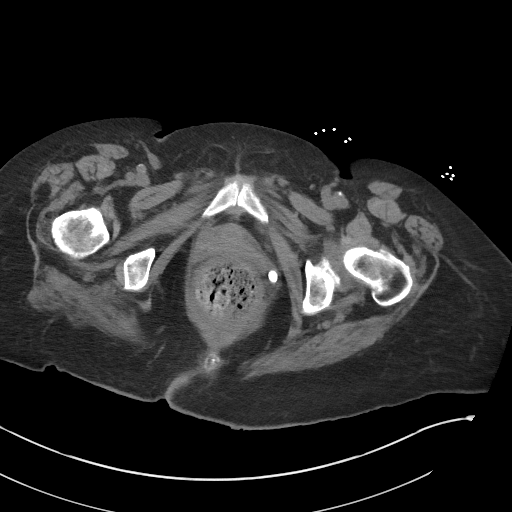
[im 6/77  bone]
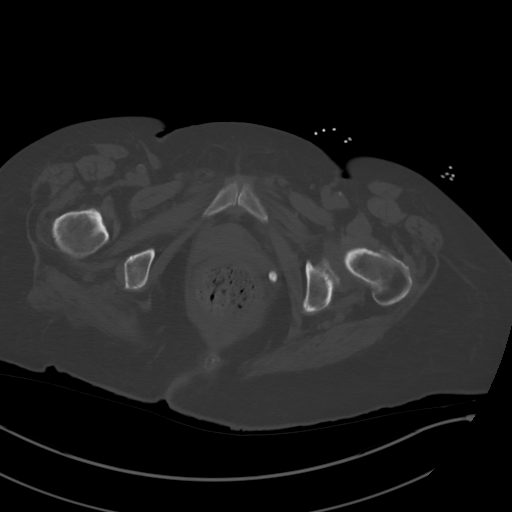
[im 18/77  soft-tissue]
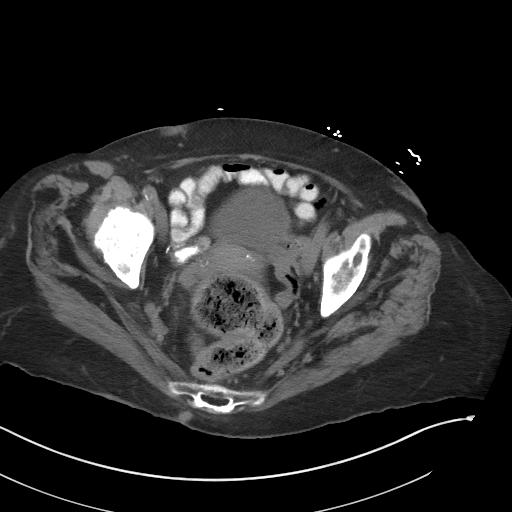
[im 24/77  soft-tissue]
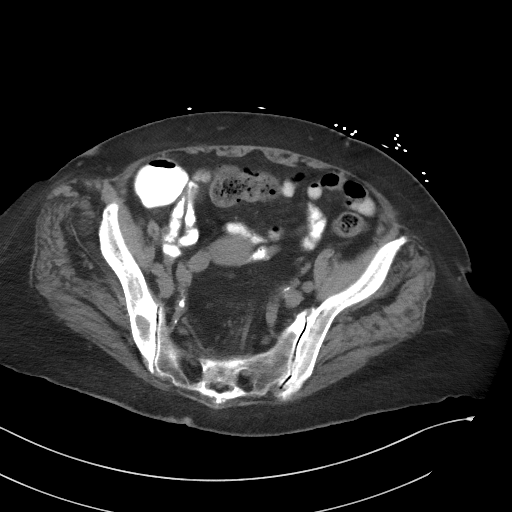
[im 36/77  soft-tissue]
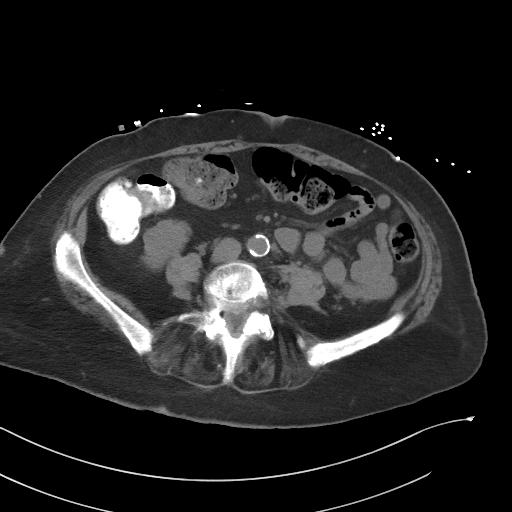
[im 41/77  soft-tissue]
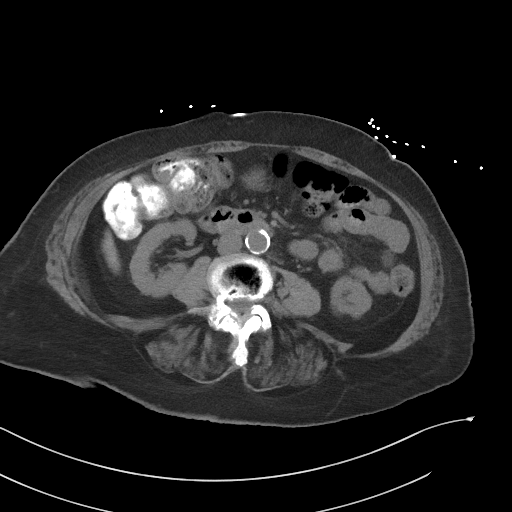
[im 53/77  soft-tissue]
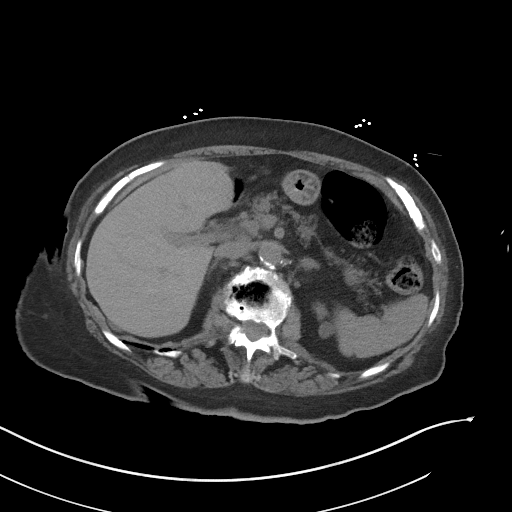
[im 59/77  soft-tissue]
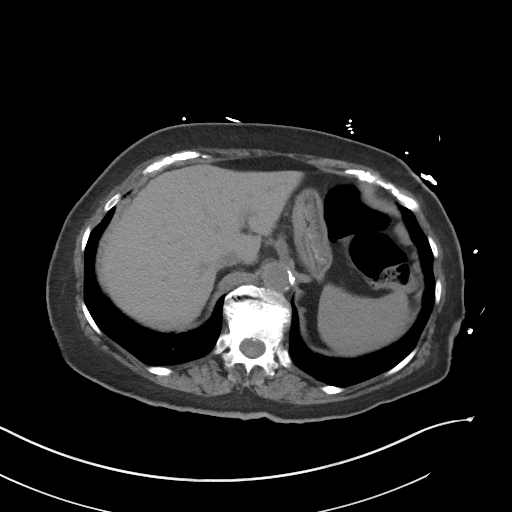
[im 71/77  soft-tissue]
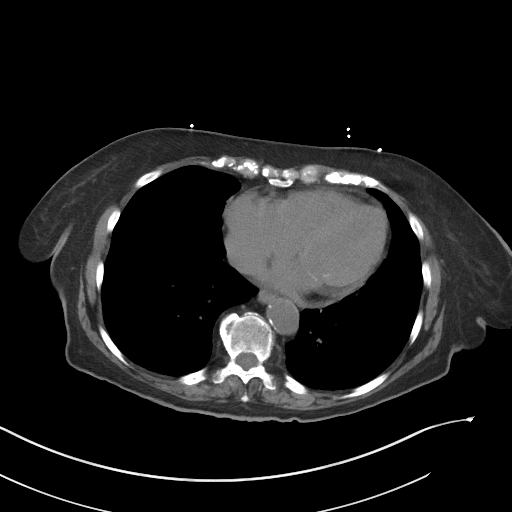

[Series 11: coronal st · coronal · 0.31mm/px · 3 of 143 slices shown]
[im 29/143  soft-tissue]
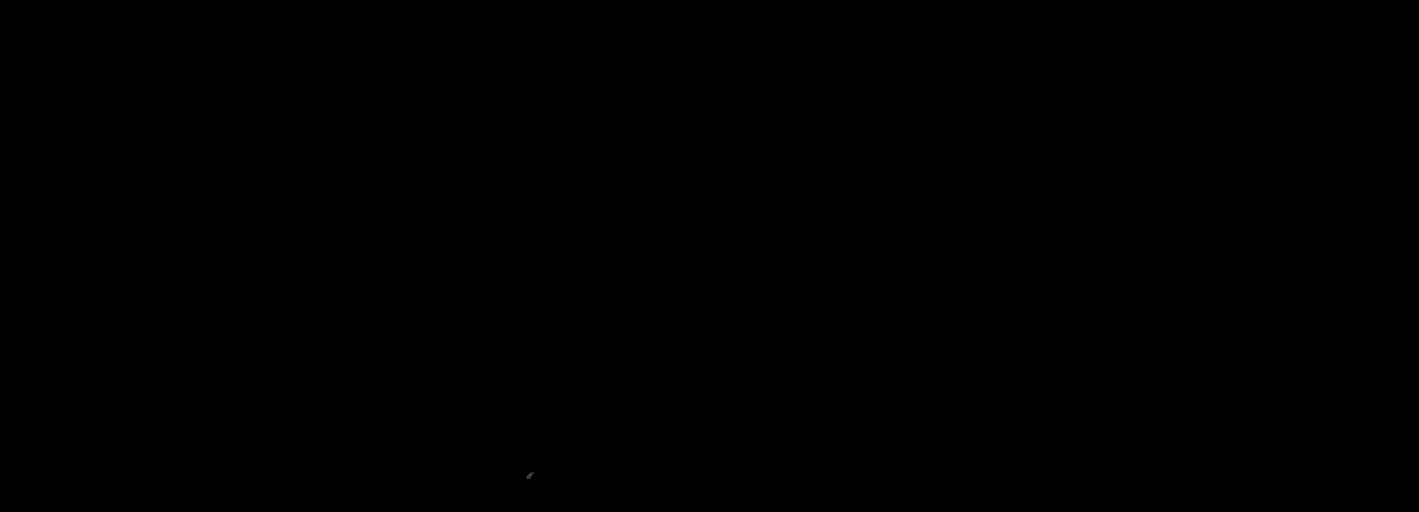
[im 57/143  soft-tissue]
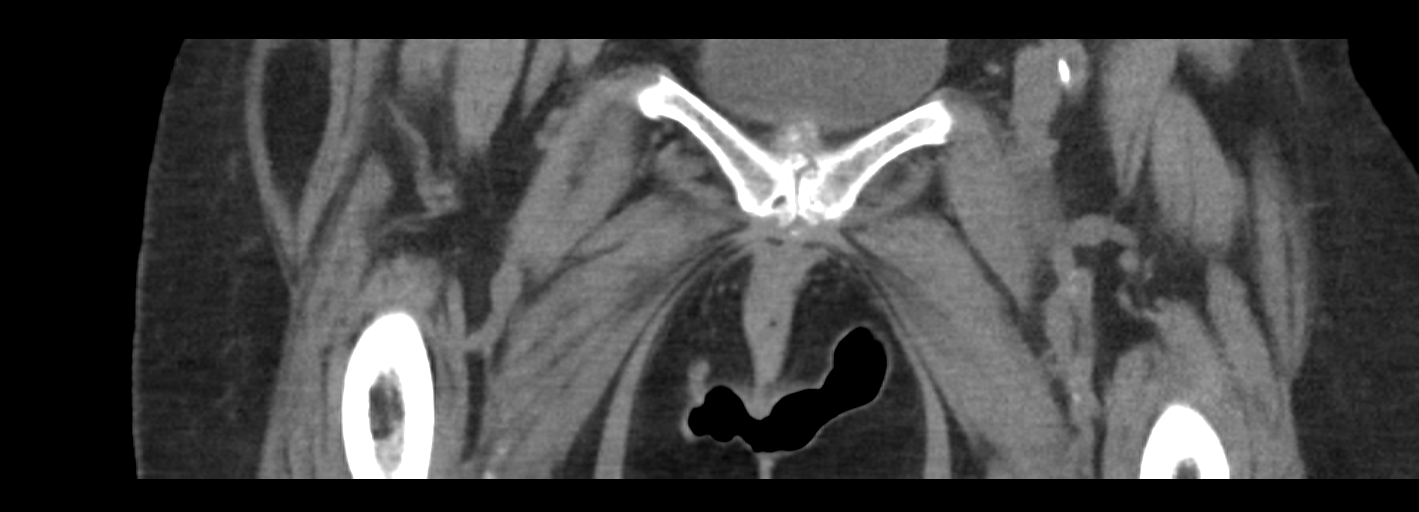
[im 86/143  soft-tissue]
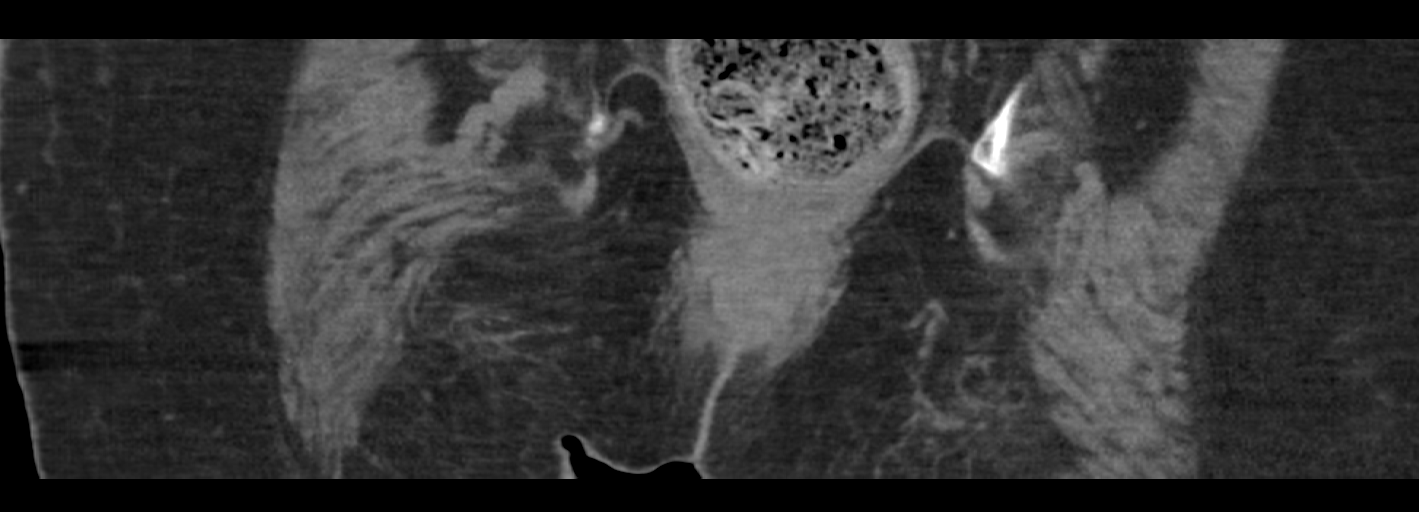

[11 of 46 positions shown; findings below may reference images not displayed]

FINDINGS: Lower chest: Bronchiectasis in the lower lobes. Peribronchial linear
lower lobe scarring or atelectasis. No lung base pneumonia or
pleural effusion. Mild cardiomegaly. Trace physiologic appearing
pericardial fluid.

Hepatobiliary: Negative noncontrast liver and gallbladder.

Pancreas: Fatty atrophy.

Spleen: Negative.

Adrenals/Urinary Tract: Normal adrenal glands. Nonobstructed
kidneys. No nephrolithiasis. Occasional small benign appearing cysts
with simple fluid density (no imaging follow-up recommended). No
hydroureter. Unremarkable bladder. Pelvic phleboliths.

Stomach/Bowel:

Large stool ball in the rectum, 8 cm diameter (sagittal image 80).
Circumferential mild rectal wall thickening there.

Stool tapers in the distal sigmoid colon. Redundant sigmoid in the
pelvis is otherwise decompressed.

Mild retained stool in the descending colon. Gas and retained stool
in the transverse colon. Oral contrast has just reached the hepatic
flexure. Appendix remains normal on coronal image 47. No large bowel
diverticulosis is identified.

Negative terminal ileum. No dilated small bowel. Decompressed
stomach and duodenum. No free air or free fluid.

Vascular/Lymphatic: Aortoiliac calcified atherosclerosis. Mildly
tortuous abdominal aorta and iliac arteries. Vascular patency is not
evaluated in the absence of IV contrast. No lymphadenopathy
identified.

Reproductive: Negative noncontrast appearance.

Other: No pelvic free fluid. Oval left anterior pelvic floor
phlebolith or postinflammatory calcified lymph node.

Musculoskeletal: Advanced lumbar spine degeneration with levoconvex
scoliosis and multilevel spondylolisthesis. Widespread lumbar vacuum
disc, endplate spurring and sclerosis. Very severe bilateral hip
osteoarthritis. No acute osseous abnormality identified.
IMPRESSION: 1. Large stool ball in the rectum with mild associated rectal wall
thickening. Consider fecal impaction, Stercoral Colitis. No large
bowel diverticulosis. Oral contrast has reached the hepatic flexure.
Appendix is normal.

2. No other acute or inflammatory process identified in the
non-contrast abdomen or pelvis.

3. Bronchiectasis in both lower lobes. Advanced lumbar spine
degeneration. Very severe bilateral hip osteoarthritis.Aortic
Atherosclerosis ([id number]).

## 2023-05-08 ENCOUNTER — Other Ambulatory Visit: Payer: Self-pay | Admitting: Internal Medicine

## 2023-05-08 DIAGNOSIS — G309 Alzheimer's disease, unspecified: Secondary | ICD-10-CM

## 2023-05-11 DIAGNOSIS — H25813 Combined forms of age-related cataract, bilateral: Secondary | ICD-10-CM | POA: Diagnosis not present

## 2023-05-11 DIAGNOSIS — D3132 Benign neoplasm of left choroid: Secondary | ICD-10-CM | POA: Diagnosis not present

## 2023-05-11 DIAGNOSIS — H401122 Primary open-angle glaucoma, left eye, moderate stage: Secondary | ICD-10-CM | POA: Diagnosis not present

## 2023-05-11 DIAGNOSIS — H401111 Primary open-angle glaucoma, right eye, mild stage: Secondary | ICD-10-CM | POA: Diagnosis not present

## 2023-05-21 ENCOUNTER — Other Ambulatory Visit: Payer: Self-pay | Admitting: Internal Medicine

## 2023-05-21 DIAGNOSIS — I4821 Permanent atrial fibrillation: Secondary | ICD-10-CM

## 2023-05-21 MED ORDER — APIXABAN 5 MG PO TABS: 5.0000 mg | ORAL_TABLET | Freq: Two times a day (BID) | ORAL | 11 refills | Status: DC

## 2023-05-21 NOTE — Telephone Encounter (Signed)
 Copied from CRM [id number]. Topic: Clinical - Medication Refill >> May 21, 2023 10:26 AM Deleta HERO wrote: Most Recent Primary Care Visit:  Provider: TOBIE SUZZANE POUR  Department: [facility]  Visit Type: PHYSICAL  Date: 11/24/2022  Medication: apixaban  (ELIQUIS ) 5 MG TABS tablet  Has the patient contacted their pharmacy? Yes, the patient has a new pharmacy she would like this sent to. (Agent: If no, request that the patient contact the pharmacy for the refill. If patient does not wish to contact the pharmacy document the reason why and proceed with request.) (Agent: If yes, when and what did the pharmacy advise?)  Is this the correct pharmacy for this prescription? Yes If no, delete pharmacy and type the correct one.  This is the patient's preferred pharmacy:   [facility] [phone] - [place], [place] - [address]  [place] [address] Phone: [phone] [phone]   Has the prescription been filled recently? No  Is the patient out of the medication? Yes  Has the patient been seen for an appointment in the last year OR does the patient have an upcoming appointment? Yes  Can we respond through MyChart? No  Agent: Please be advised that Rx refills may take up to 3 business days. We ask that you follow-up with your pharmacy.

## 2023-05-21 NOTE — Telephone Encounter (Signed)
 Copied from CRM [id number]. Topic: Clinical - Medication Refill >> May 21, 2023 10:26 AM Deleta HERO wrote: Most Recent Primary Care Visit:  Provider: TOBIE SUZZANE POUR  Department: [facility]  Visit Type: PHYSICAL  Date: 11/24/2022  Medication: apixaban  (ELIQUIS ) 5 MG TABS tablet  Has the patient contacted their pharmacy? Yes, the patient has a new pharmacy she would like this sent to. (Agent: If no, request that the patient contact the pharmacy for the refill. If patient does not wish to contact the pharmacy document the reason why and proceed with request.) (Agent: If yes, when and what did the pharmacy advise?)  Is this the correct pharmacy for this prescription? Yes If no, delete pharmacy and type the correct one.  This is the patient's preferred pharmacy:   [facility] [phone] - [place], [place] - [address]  [place] [address] Phone: [phone] Fax:[phone]   Has the prescription been filled recently? No  Is the patient out of the medication? Yes  Has the patient been seen for an appointment in the last year OR does the patient have an upcoming appointment?   Can we respond through MyChart?   Agent: Please be advised that Rx refills may take up to 3 business days. We ask that you follow-up with your pharmacy.

## 2023-05-27 ENCOUNTER — Encounter: Payer: Self-pay | Admitting: Internal Medicine

## 2023-05-27 ENCOUNTER — Ambulatory Visit (INDEPENDENT_AMBULATORY_CARE_PROVIDER_SITE_OTHER): Payer: Medicare Other | Admitting: Internal Medicine

## 2023-05-27 VITALS — BP 102/65 | HR 74 | Ht 64.0 in | Wt 165.0 lb

## 2023-05-27 DIAGNOSIS — I4821 Permanent atrial fibrillation: Secondary | ICD-10-CM

## 2023-05-27 DIAGNOSIS — G309 Alzheimer's disease, unspecified: Secondary | ICD-10-CM | POA: Diagnosis not present

## 2023-05-27 DIAGNOSIS — I10 Essential (primary) hypertension: Secondary | ICD-10-CM | POA: Diagnosis not present

## 2023-05-27 DIAGNOSIS — E782 Mixed hyperlipidemia: Secondary | ICD-10-CM

## 2023-05-27 DIAGNOSIS — N1831 Chronic kidney disease, stage 3a: Secondary | ICD-10-CM

## 2023-05-27 DIAGNOSIS — F02B18 Dementia in other diseases classified elsewhere, moderate, with other behavioral disturbance: Secondary | ICD-10-CM

## 2023-05-27 DIAGNOSIS — K5904 Chronic idiopathic constipation: Secondary | ICD-10-CM

## 2023-05-27 DIAGNOSIS — F02B0 Dementia in other diseases classified elsewhere, moderate, without behavioral disturbance, psychotic disturbance, mood disturbance, and anxiety: Secondary | ICD-10-CM

## 2023-05-27 MED ORDER — MEMANTINE HCL 5 MG PO TABS
5.0000 mg | ORAL_TABLET | Freq: Two times a day (BID) | ORAL | 1 refills | Status: AC
Start: 2023-05-27 — End: ?

## 2023-05-27 MED ORDER — METOPROLOL SUCCINATE ER 25 MG PO TB24: 25.0000 mg | ORAL_TABLET | Freq: Every day | ORAL | 1 refills | Status: DC

## 2023-05-27 MED ORDER — DONEPEZIL HCL 10 MG PO TABS: 10.0000 mg | ORAL_TABLET | Freq: Every day | ORAL | 3 refills | Status: DC

## 2023-05-27 MED ORDER — APIXABAN 5 MG PO TABS: 5.0000 mg | ORAL_TABLET | Freq: Two times a day (BID) | ORAL | 11 refills | Status: DC

## 2023-05-27 MED ORDER — SIMVASTATIN 20 MG PO TABS: 20.0000 mg | ORAL_TABLET | Freq: Every day | ORAL | 3 refills | Status: DC

## 2023-05-27 NOTE — Assessment & Plan Note (Addendum)
 Last BMP reviewed, GFR ranges between 45-55 Avoid nephrotoxic agents Maintain adequate hydration Check CBC, BMP, PTH

## 2023-05-27 NOTE — Patient Instructions (Signed)
 Please start taking Metoprolol  25 mg once daily instead of 50 mg.  Please continue to take medications as prescribed.  Please continue to follow heart healthy diet and ambulate as tolerated.

## 2023-05-27 NOTE — Assessment & Plan Note (Signed)
On Zocor 

## 2023-05-27 NOTE — Assessment & Plan Note (Signed)
Improved with Linzess now [employer] as needed Followed by GI

## 2023-05-27 NOTE — Assessment & Plan Note (Addendum)
 Rate-controlled with Metoprolol , but would decrease dose to 25 mg every day due to bradycardia and borderline low BP On Eliquis  5 mg BID Followed by Cardiology in [place]

## 2023-05-27 NOTE — Assessment & Plan Note (Signed)
On donepezil and memantine A&O X 3 currently, but sleeps at daytime and stays awake during nighttime Okay to take melatonin as needed for insomnia No episodes of agitation, but does not talk much with other people than her daughter and son-in-law 

## 2023-05-27 NOTE — Progress Notes (Signed)
 Established Patient Office Visit  Subjective:  Patient ID: Brittany Hanson, female    DOB: 1932/01/21  Age: 89 y.o. MRN: [id number]  CC:  Chief Complaint  Patient presents with   Follow-up    HPI Brittany Hanson is a 89 y.o. female with past medical history of atrial fibrillation, HTN, Alzheimer's dementia, diffuse OA and physical deconditioning who presents for f/u of her chronic medical conditions.  HTN: BP is low normal, but stable compared to prior. Takes metoprolol  50 mg QD regularly for A-fib, instead of 25 mg. Patient denies headache, dizziness, chest pain, dyspnea or palpitations.  Atrial fibrillation: She is followed by cardiology in [place].  She is on metoprolol  and Eliquis  currently.  She does not have any history of cardiac procedure in the past.  Alzheimer's dementia: She is on donepezil  and memantine  currently.  She is dependent for most of her ADLs.  She is able to eat by herself, but daughter has to help prepare the meals, bathing and clothing.  She watches TV during the nighttime and and sleeps during the daytime. Daughter has reported in the past that patient has spells of worsening of dementia, that she repeats the same questions, but denies any episode of agitation, delusion or hallucinations.  Constipation has improved with Linzess  now.  Denies any melena or hematochezia now.     Past Medical History:  Diagnosis Date   Atrial fibrillation (HCC)    Dementia (HCC)    Hypertension    Osteoarthritis     Past Surgical History:  Procedure Laterality Date   COLONOSCOPY WITH PROPOFOL  N/A 08/14/2021   internal/external hemorrhoids   ESOPHAGOGASTRODUODENOSCOPY (EGD) WITH PROPOFOL  N/A 08/14/2021   2 cm sliding hiatal hernia otherwise normal   GIVENS CAPSULE STUDY N/A 08/15/2021   Procedure: GIVENS CAPSULE STUDY;  Surgeon: Suzette Espy, MD;  Location: [facility];  Service: Endoscopy;  Laterality: N/A;    Family History  Problem Relation Age of Onset    Diabetes Sister    Cancer Daughter     Social History   Socioeconomic History   Marital status: Widowed    Spouse name: Not on file   Number of children: 3   Years of education: 6th   Highest education level: Not on file  Occupational History   Not on file  Tobacco Use   Smoking status: Never   Smokeless tobacco: Never  Vaping Use   Vaping status: Never Used  Substance and Sexual Activity   Alcohol use: Never   Drug use: Never   Sexual activity: Not Currently  Other Topics Concern   Not on file  Social History Narrative   Not on file   Social Drivers of Health   Financial Resource Strain: Not on file  Food Insecurity: Not on file  Transportation Needs: Not on file  Physical Activity: Not on file  Stress: Not on file  Social Connections: Not on file  Intimate Partner Violence: Not on file    Outpatient Medications Prior to Visit  Medication Sig Dispense Refill   Acetaminophen  (TYLENOL  ARTHRITIS PAIN PO) Take 2 tablets by mouth in the morning.     ferrous sulfate 325 (65 FE) MG tablet Take 325 mg by mouth daily with breakfast.     ketoconazole  (NIZORAL ) 2 % cream APPLY 1 APPLICATION TOPICALLY DAILY 15 g 0   latanoprost (XALATAN) 0.005 % ophthalmic solution Place 1 drop into both eyes at bedtime.     linaclotide  (LINZESS ) 72 MCG capsule Take  1 capsule (72 mcg total) by mouth daily before breakfast. 90 capsule 3   polyethylene glycol (MIRALAX  / GLYCOLAX ) 17 g packet Take 17 g by mouth daily.     apixaban  (ELIQUIS ) 5 MG TABS tablet Take 1 tablet (5 mg total) by mouth 2 (two) times daily. 60 tablet 11   donepezil  (ARICEPT ) 10 MG tablet TAKE 1 TABLET BY MOUTH EVERYDAY AT BEDTIME 90 tablet 3   memantine  (NAMENDA ) 5 MG tablet TAKE 1 TABLET BY MOUTH TWICE A DAY 60 tablet 5   metoprolol  succinate (TOPROL -XL) 25 MG 24 hr tablet Take 1 tablet (25 mg total) by mouth daily. Take with or immediately following a meal. 90 tablet 1   metoprolol  succinate (TOPROL -XL) 50 MG 24 hr tablet  Take 50 mg by mouth daily.     simvastatin  (ZOCOR ) 20 MG tablet Take 1 tablet (20 mg total) by mouth daily at 6 PM. 90 tablet 3   No facility-administered medications prior to visit.    No Known Allergies  ROS Review of Systems  Constitutional:  Positive for fatigue. Negative for chills and fever.  HENT:  Negative for congestion, sinus pressure, sinus pain and sore throat.   Eyes:  Negative for pain and discharge.  Respiratory:  Negative for cough and shortness of breath.   Cardiovascular:  Negative for chest pain and palpitations.  Gastrointestinal:  Positive for constipation. Negative for abdominal pain, diarrhea, nausea and vomiting.  Endocrine: Negative for polydipsia and polyuria.  Genitourinary:  Negative for dysuria and hematuria.  Musculoskeletal:  Positive for arthralgias, back pain and gait problem. Negative for neck pain and neck stiffness.  Skin:  Negative for rash.  Neurological:  Negative for dizziness and weakness.  Psychiatric/Behavioral:  Positive for decreased concentration and sleep disturbance. Negative for agitation and behavioral problems.       Objective:    Physical Exam Vitals reviewed.  Constitutional:      General: She is not in acute distress.    Appearance: She is not diaphoretic.     Comments: In wheelchair  HENT:     Head: Normocephalic and atraumatic.     Mouth/Throat:     Mouth: Mucous membranes are moist.  Eyes:     General: No scleral icterus.    Extraocular Movements: Extraocular movements intact.  Cardiovascular:     Rate and Rhythm: Normal rate. Rhythm irregular.     Pulses: Normal pulses.     Heart sounds: Normal heart sounds. No murmur heard. Pulmonary:     Breath sounds: Normal breath sounds. No wheezing or rales.  Abdominal:     Palpations: Abdomen is soft.     Tenderness: There is no abdominal tenderness.  Musculoskeletal:        General: Swelling (B/l knee) present.     Cervical back: Neck supple. No tenderness.     Right  lower leg: Edema (1+) present.     Left lower leg: Edema (1+) present.     Comments: ROM limited at b/l knee up to 45 degrees  Skin:    General: Skin is warm.     Findings: No rash.  Neurological:     General: No focal deficit present.     Mental Status: She is alert. Mental status is at baseline.     Motor: Weakness (RLE and LLE - 2/5, RUE and LUE - 3/5) present.     Gait: Gait abnormal (Non-ambulatory mostly).  Psychiatric:        Mood and Affect: Mood normal.  Behavior: Behavior normal.     BP 102/65   Pulse 74   Ht 5\' 4"  (1.626 m)   Wt 165 lb (74.8 kg)   SpO2 94%   BMI 28.32 kg/m  Wt Readings from Last 3 Encounters:  05/27/23 165 lb (74.8 kg)  07/23/22 158 lb (71.7 kg)  07/17/22 158 lb 9.6 oz (71.9 kg)    Lab Results  Component Value Date   TSH 2.570 11/24/2022   Lab Results  Component Value Date   WBC 5.5 11/24/2022   HGB 13.0 11/24/2022   HCT 40.1 11/24/2022   MCV 94 11/24/2022   PLT 189 11/24/2022   Lab Results  Component Value Date   NA 141 11/24/2022   K 4.6 11/24/2022   CO2 24 11/24/2022   GLUCOSE 100 (H) 11/24/2022   BUN 22 11/24/2022   CREATININE 1.28 (H) 11/24/2022   BILITOT 0.4 11/24/2022   ALKPHOS 91 11/24/2022   AST 19 11/24/2022   ALT 10 11/24/2022   PROT 6.1 11/24/2022   ALBUMIN 4.0 11/24/2022   CALCIUM 9.8 11/24/2022   ANIONGAP 7 08/13/2021   EGFR 40 (L) 11/24/2022   Lab Results  Component Value Date   CHOL 149 11/24/2022   Lab Results  Component Value Date   HDL 60 11/24/2022   Lab Results  Component Value Date   LDLCALC 74 11/24/2022   Lab Results  Component Value Date   TRIG 77 11/24/2022   Lab Results  Component Value Date   CHOLHDL 2.5 11/24/2022   Lab Results  Component Value Date   HGBA1C 5.6 11/24/2022      Assessment & Plan:   Problem List Items Addressed This Visit       Cardiovascular and Mediastinum   Atrial fibrillation (HCC) - Primary   Rate-controlled with Metoprolol , but would  decrease dose to 25 mg every day due to bradycardia and borderline low BP On Eliquis  5 mg BID Followed by Cardiology in [place]      Relevant Medications   metoprolol  succinate (TOPROL -XL) 25 MG 24 hr tablet   apixaban  (ELIQUIS ) 5 MG TABS tablet   simvastatin  (ZOCOR ) 20 MG tablet   Essential hypertension   BP Readings from Last 1 Encounters:  05/27/23 102/65   Well-controlled, but decrease dose of Metoprolol  to 25 mg to avoid hypotension Counseled for compliance with the medications Advised DASH diet      Relevant Medications   metoprolol  succinate (TOPROL -XL) 25 MG 24 hr tablet   apixaban  (ELIQUIS ) 5 MG TABS tablet   simvastatin  (ZOCOR ) 20 MG tablet     Nervous and Auditory   Dementia (HCC)   On donepezil  and memantine  A&O X 3 currently, but sleeps at daytime and stays awake during nighttime Okay to take melatonin as needed for insomnia No episodes of agitation, but does not talk much with other people than her daughter and son-in-law      Relevant Medications   donepezil  (ARICEPT ) 10 MG tablet   memantine  (NAMENDA ) 5 MG tablet     Genitourinary   Stage 3a chronic kidney disease (HCC)   Last BMP reviewed, GFR ranges between 45-55 Avoid nephrotoxic agents Maintain adequate hydration Check CBC, BMP, PTH      Relevant Orders   CBC with Differential/Platelet   Basic Metabolic Panel (BMET)   Parathyroid  hormone, intact (no Ca)     Other   Constipation   Improved with Linzess  now Takes MiraLAX  as needed Followed by GI      Mixed  hyperlipidemia   On Zocor       Relevant Medications   metoprolol  succinate (TOPROL -XL) 25 MG 24 hr tablet   apixaban  (ELIQUIS ) 5 MG TABS tablet   simvastatin  (ZOCOR ) 20 MG tablet     Meds ordered this encounter  Medications   metoprolol  succinate (TOPROL -XL) 25 MG 24 hr tablet    Sig: Take 1 tablet (25 mg total) by mouth daily. Take with or immediately following a meal.    Dispense:  90 tablet    Refill:  1    Please  discontinue 50 mg dose.   apixaban  (ELIQUIS ) 5 MG TABS tablet    Sig: Take 1 tablet (5 mg total) by mouth 2 (two) times daily.    Dispense:  60 tablet    Refill:  11   donepezil  (ARICEPT ) 10 MG tablet    Sig: Take 1 tablet (10 mg total) by mouth at bedtime.    Dispense:  90 tablet    Refill:  3   memantine  (NAMENDA ) 5 MG tablet    Sig: Take 1 tablet (5 mg total) by mouth 2 (two) times daily.    Dispense:  180 tablet    Refill:  1   simvastatin  (ZOCOR ) 20 MG tablet    Sig: Take 1 tablet (20 mg total) by mouth daily at 6 PM.    Dispense:  90 tablet    Refill:  3    Follow-up: Return in about 6 months (around 11/24/2023) for Annual physical (after 11/24/22).    Meldon Sport, MD

## 2023-05-27 NOTE — Assessment & Plan Note (Addendum)
 BP Readings from Last 1 Encounters:  05/27/23 102/65   Well-controlled, but decrease dose of Metoprolol  to 25 mg to avoid hypotension Counseled for compliance with the medications Advised DASH diet

## 2023-05-29 LAB — CBC WITH DIFFERENTIAL/PLATELET
Basophils Absolute: 0.1 10*3/uL (ref 0.0–0.2)
Basos: 1 %
EOS (ABSOLUTE): 0.1 10*3/uL (ref 0.0–0.4)
Eos: 3 %
Hematocrit: 40.9 % (ref 34.0–46.6)
Hemoglobin: 13 g/dL (ref 11.1–15.9)
Immature Grans (Abs): 0 10*3/uL (ref 0.0–0.1)
Immature Granulocytes: 1 %
Lymphocytes Absolute: 0.8 10*3/uL (ref 0.7–3.1)
Lymphs: 16 %
MCH: 30.1 pg (ref 26.6–33.0)
MCHC: 31.8 g/dL (ref 31.5–35.7)
MCV: 95 fL (ref 79–97)
Monocytes Absolute: 0.5 10*3/uL (ref 0.1–0.9)
Monocytes: 9 %
Neutrophils Absolute: 3.8 10*3/uL (ref 1.4–7.0)
Neutrophils: 70 %
Platelets: 209 10*3/uL (ref 150–450)
RBC: 4.32 x10E6/uL (ref 3.77–5.28)
RDW: 12.9 % (ref 11.7–15.4)
WBC: 5.3 10*3/uL (ref 3.4–10.8)

## 2023-05-29 LAB — PARATHYROID HORMONE, INTACT (NO CA): PTH: 34 pg/mL (ref 15–65)

## 2023-05-29 LAB — BASIC METABOLIC PANEL
BUN/Creatinine Ratio: 21 (ref 12–28)
BUN: 26 mg/dL (ref 8–27)
CO2: 21 mmol/L (ref 20–29)
Calcium: 9.8 mg/dL (ref 8.7–10.3)
Chloride: 104 mmol/L (ref 96–106)
Creatinine, Ser: 1.23 mg/dL — ABNORMAL HIGH (ref 0.57–1.00)
Glucose: 134 mg/dL — ABNORMAL HIGH (ref 70–99)
Potassium: 4.4 mmol/L (ref 3.5–5.2)
Sodium: 140 mmol/L (ref 134–144)
eGFR: 42 mL/min/{1.73_m2} — ABNORMAL LOW (ref >59)

## 2023-07-16 ENCOUNTER — Other Ambulatory Visit: Payer: Self-pay | Admitting: Internal Medicine

## 2023-07-16 DIAGNOSIS — I4821 Permanent atrial fibrillation: Secondary | ICD-10-CM

## 2023-07-27 ENCOUNTER — Other Ambulatory Visit: Payer: Self-pay | Admitting: Internal Medicine

## 2023-07-27 MED ORDER — LINACLOTIDE 72 MCG PO CAPS: 72.0000 ug | ORAL_CAPSULE | Freq: Every day | ORAL | 3 refills | Status: DC

## 2023-07-27 NOTE — Telephone Encounter (Signed)
 Not our patient please send to the correct office.

## 2023-07-27 NOTE — Telephone Encounter (Signed)
 Copied from CRM [id number]. Topic: Clinical - Medication Refill >> Jul 27, 2023  8:06 AM Everette C wrote: Most Recent Primary Care Visit:  Provider: Anabel Halon  Department: [facility]  Visit Type: OFFICE VISIT  Date: 05/27/2023  Medication: linaclotide (LINZESS) 72 MCG capsule [[id number]]  Has the patient contacted their pharmacy? Yes (Agent: If no, request that the patient contact the pharmacy for the refill. If patient does not wish to contact the pharmacy document the reason why and proceed with request.) (Agent: If yes, when and what did the pharmacy advise?)  Is this the correct pharmacy for this prescription? Yes If no, delete pharmacy and type the correct one.  This is the patient's preferred pharmacy:  [facility] [phone] - [place], [place] - [address] AT [facility] [address]  [place] [address] Phone: [phone] Fax: [phone]   Has the prescription been filled recently? Yes  Is the patient out of the medication? Yes  Has the patient been seen for an appointment in the last year OR does the patient have an upcoming appointment? Yes  Can we respond through MyChart? No  Agent: Please be advised that Rx refills may take up to 3 business days. We ask that you follow-up with your pharmacy.

## 2023-07-28 ENCOUNTER — Ambulatory Visit (INDEPENDENT_AMBULATORY_CARE_PROVIDER_SITE_OTHER): Payer: Medicare Other

## 2023-07-28 VITALS — BP 102/65 | Ht 64.0 in | Wt 145.0 lb

## 2023-07-28 DIAGNOSIS — Z2821 Immunization not carried out because of patient refusal: Secondary | ICD-10-CM | POA: Diagnosis not present

## 2023-07-28 DIAGNOSIS — Z0000 Encounter for general adult medical examination without abnormal findings: Secondary | ICD-10-CM | POA: Diagnosis not present

## 2023-07-28 DIAGNOSIS — Z5320 Procedure and treatment not carried out because of patient's decision for unspecified reasons: Secondary | ICD-10-CM

## 2023-07-28 NOTE — Progress Notes (Signed)
 Because this visit was a virtual/telehealth visit,  certain criteria was not obtained, such a blood pressure, CBG if applicable, and timed get up and go. Any medications not marked as "taking" were not mentioned during the medication reconciliation part of the visit. Any vitals not documented were not able to be obtained due to this being a telehealth visit or patient was unable to self-report a recent blood pressure reading due to a lack of equipment at home via telehealth. Vitals that have been documented are verbally provided by the patient.  Subjective:   Brittany Hanson is a 89 y.o. who presents for a Medicare Wellness preventive visit.  Visit Complete: Virtual I connected with  Brittany Hanson on 07/28/23 by a audio enabled telemedicine application and verified that I am speaking with the correct person using two identifiers.  Patient Location: Home  Provider Location: Home Office  I discussed the limitations of evaluation and management by telemedicine. The patient expressed understanding and agreed to proceed.  Vital Signs: Because this visit was a virtual/telehealth visit, some criteria may be missing or patient reported. Any vitals not documented were not able to be obtained and vitals that have been documented are patient reported.  VideoDeclined- This patient declined Librarian, academic. Therefore the visit was completed with audio only.  Persons Participating in Visit: patient daughter assisted her mom  AWV Questionnaire: No: Patient Medicare AWV questionnaire was not completed prior to this visit.  Cardiac Risk Factors include: advanced age (>18men, >24 women);dyslipidemia;Other (see comment), Risk factor comments: A-fib     Objective:    Today's Vitals   07/28/23 1110  Weight: 145 lb (65.8 kg)  Height: 5\' 4"  (1.626 m)   Body mass index is 24.89 kg/m.     07/28/2023   11:09 AM 08/11/2021   10:30 PM 06/11/2021    1:15 PM  Advanced  Directives  Does Patient Have a Medical Advance Directive? No No Yes  Type of Engineer, manufacturing of Healthcare Power of Attorney in Chart?   No - copy requested  Would patient like information on creating a medical advance directive?  No - Patient declined     Current Medications (verified) Outpatient Encounter Medications as of 07/28/2023  Medication Sig   Acetaminophen (TYLENOL ARTHRITIS PAIN PO) Take 2 tablets by mouth in the morning.   donepezil (ARICEPT) 10 MG tablet Take 1 tablet (10 mg total) by mouth at bedtime.   ELIQUIS 5 MG TABS tablet TAKE 1 TABLET BY MOUTH TWICE DAILY.   ferrous sulfate 325 (65 FE) MG tablet Take 325 mg by mouth daily with breakfast.   ketoconazole (NIZORAL) 2 % cream APPLY 1 APPLICATION TOPICALLY DAILY   latanoprost (XALATAN) 0.005 % ophthalmic solution Place 1 drop into both eyes at bedtime.   linaclotide (LINZESS) 72 MCG capsule Take 1 capsule (72 mcg total) by mouth daily before breakfast.   memantine (NAMENDA) 5 MG tablet Take 1 tablet (5 mg total) by mouth 2 (two) times daily.   metoprolol succinate (TOPROL-XL) 25 MG 24 hr tablet Take 1 tablet (25 mg total) by mouth daily. Take with or immediately following a meal.   polyethylene glycol (MIRALAX / GLYCOLAX) 17 g packet Take 17 g by mouth daily.   simvastatin (ZOCOR) 20 MG tablet Take 1 tablet (20 mg total) by mouth daily at 6 PM.   No facility-administered encounter medications on file as of 07/28/2023.    Allergies (verified) Patient  has no known allergies.   History: Past Medical History:  Diagnosis Date   Atrial fibrillation (HCC)    Dementia (HCC)    Hypertension    Osteoarthritis    Past Surgical History:  Procedure Laterality Date   COLONOSCOPY WITH PROPOFOL N/A 08/14/2021   internal/external hemorrhoids   ESOPHAGOGASTRODUODENOSCOPY (EGD) WITH PROPOFOL N/A 08/14/2021   2 cm sliding hiatal hernia otherwise normal   GIVENS CAPSULE STUDY N/A 08/15/2021    Procedure: GIVENS CAPSULE STUDY;  Surgeon: Corbin Ade, MD;  Location: [facility];  Service: Endoscopy;  Laterality: N/A;   Family History  Problem Relation Age of Onset   Diabetes Sister    Cancer Daughter    Social History   Socioeconomic History   Marital status: Widowed    Spouse name: Not on file   Number of children: 3   Years of education: 6th   Highest education level: Not on file  Occupational History   Not on file  Tobacco Use   Smoking status: Never   Smokeless tobacco: Never  Vaping Use   Vaping status: Never Used  Substance and Sexual Activity   Alcohol use: Never   Drug use: Never   Sexual activity: Not Currently  Other Topics Concern   Not on file  Social History Narrative   Not on file   Social Drivers of Health   Financial Resource Strain: Low Risk  (07/28/2023)   Overall Financial Resource Strain (CARDIA)    Difficulty of Paying Living Expenses: Not hard at all  Food Insecurity: No Food Insecurity (07/28/2023)   Hunger Vital Sign    Worried About Running Out of Food in the Last Year: Never true    Ran Out of Food in the Last Year: Never true  Transportation Needs: No Transportation Needs (07/28/2023)   PRAPARE - Administrator, Civil Service (Medical): No    Lack of Transportation (Non-Medical): No  Physical Activity: Insufficiently Active (07/28/2023)   Exercise Vital Sign    Days of Exercise per Week: 1 day    Minutes of Exercise per Session: 10 min  Stress: No Stress Concern Present (07/28/2023)   [employer] of Occupational Health - Occupational Stress Questionnaire    Feeling of Stress : Not at all  Social Connections: Moderately Integrated (07/28/2023)   Social Connection and Isolation Panel [NHANES]    Frequency of Communication with Friends and Family: Twice a week    Frequency of Social Gatherings with Friends and Family: Twice a week    Attends Religious Services: 1 to 4 times per year    Active Member of [employer]  or Organizations: Yes    Attends Banker Meetings: 1 to 4 times per year    Marital Status: Widowed    Tobacco Counseling Counseling given: Not Answered    Clinical Intake:  Pre-visit preparation completed: Yes  Pain : No/denies pain     BMI - recorded: 24.89 Nutritional Status: BMI of 19-24  Normal Nutritional Risks: None Diabetes: No  How often do you need to have someone help you when you read instructions, pamphlets, or other written materials from your doctor or pharmacy?: 2 - Rarely  Interpreter Needed?: No  Information entered by :: Shay Jhaveri,CMA   Activities of Daily Living     07/28/2023   11:20 AM  In your present state of health, do you have any difficulty performing the following activities:  Hearing? 1  Vision? 0  Difficulty concentrating or making  decisions? 0  Walking or climbing stairs? 1  Dressing or bathing? 1  Comment she can dress herself daughter bathe her  Doing errands, shopping? 1  Preparing Food and eating ? Y  Using the Toilet? N  In the past six months, have you accidently leaked urine? N  Do you have problems with loss of bowel control? N  Managing your Medications? Y  Comment pt daughter does meds  Managing your Finances? Y  Housekeeping or managing your Housekeeping? Y  Comment pt daughters helps    Patient Care Team: Anabel Halon, MD as PCP - General (Internal Medicine) Lanelle Bal, DO as Consulting Physician (Gastroenterology)  Indicate any recent Medical Services you may have received from other than [facility] providers in the past year (date may be approximate).     Assessment:   This is a routine wellness examination for [place].  Hearing/Vision screen Hearing Screening - Comments:: No difficulty hearing  Vision Screening - Comments:: No difficulty seeing   Goals Addressed             This Visit's Progress    Eat Healthy        Notes: stay healthly       Depression Screen      07/28/2023   11:23 AM 11/24/2022   10:53 AM 06/24/2022    2:48 PM 05/26/2022    1:12 PM 11/20/2021    1:07 PM 10/11/2021    3:09 PM 09/13/2021    1:10 PM  PHQ 2/9 Scores  PHQ - 2 Score 0 0 0 0 0 0 0  PHQ- 9 Score 4          Fall Risk     07/28/2023   11:17 AM 11/24/2022   10:53 AM 06/24/2022    2:48 PM 05/26/2022    1:12 PM 02/18/2022    8:36 AM  Fall Risk   Falls in the past year? 0 1 0 0 0  Number falls in past yr: 0 0 0 0 0  Injury with Fall? 0 0 0 0 0  Risk for fall due to : Impaired balance/gait;Impaired mobility      Follow up Falls evaluation completed        MEDICARE RISK AT HOME:  Medicare Risk at Home Any stairs in or around the home?: Yes If so, are there any without handrails?: No Home free of loose throw rugs in walkways, pet beds, electrical cords, etc?: Yes Adequate lighting in your home to reduce risk of falls?: Yes Life alert?: No Use of a cane, walker or w/c?: Yes Grab bars in the bathroom?: Yes Shower chair or bench in shower?: Yes Elevated toilet seat or a handicapped toilet?: Yes  TIMED UP AND GO:  Was the test performed?  No  Cognitive Function: 6CIT completed        07/28/2023   11:15 AM 06/11/2021    1:20 PM  6CIT Screen  What Year? 4 points 4 points  What month? 3 points 3 points  What time? 3 points 3 points  Count back from 20 4 points 4 points  Months in reverse 4 points 4 points  Repeat phrase 10 points 10 points  Total Score 28 points 28 points    Immunizations Immunization History  Administered Date(s) Administered   PNEUMOCOCCAL CONJUGATE-20 11/20/2021    Screening Tests Health Maintenance  Topic Date Due   DTaP/Tdap/Td (1 - Tdap) Never done   Zoster Vaccines- Shingrix (1 of 2) Never done  DEXA SCAN  Never done   INFLUENZA VACCINE  08/10/2023 (Originally 12/11/2022)   Medicare Annual Wellness (AWV)  07/27/2024   Pneumonia Vaccine 42+ Years old  Completed   HPV VACCINES  Aged Out   COVID-19 Vaccine  Discontinued    Health  Maintenance  Health Maintenance Due  Topic Date Due   DTaP/Tdap/Td (1 - Tdap) Never done   Zoster Vaccines- Shingrix (1 of 2) Never done   DEXA SCAN  Never done   Health Maintenance Items Addressed: patient was told to get vaccines and Dexa Scan she declined both at this time.  Additional Screening:  Vision Screening: Recommended annual ophthalmology exams for early detection of glaucoma and other disorders of the eye.  Dental Screening: Recommended annual dental exams for proper oral hygiene  Community Resource Referral / Chronic Care Management: CRR required this visit?  No   CCM required this visit?  No     Plan:     I have personally reviewed and noted the following in the patient's chart:   Medical and social history Use of alcohol, tobacco or illicit drugs  Current medications and supplements including opioid prescriptions. Patient is not currently taking opioid prescriptions. Functional ability and status Nutritional status Physical activity Advanced directives List of other physicians Hospitalizations, surgeries, and ER visits in previous 12 months Vitals Screenings to include cognitive, depression, and falls Referrals and appointments  In addition, I have reviewed and discussed with patient certain preventive protocols, quality metrics, and best practice recommendations. A written personalized care plan for preventive services as well as general preventive health recommendations were provided to patient.     Rudi Heap, [place]   07/28/2023   After Visit Summary: (Declined) Due to this being a telephonic visit, with patients personalized plan was offered to patient but patient Declined AVS at this time   Notes: Nothing significant to report at this time.

## 2023-07-28 NOTE — Patient Instructions (Signed)
 Brittany Hanson , Thank you for taking time to come for your Medicare Wellness Visit. I appreciate your ongoing commitment to your health goals. Please review the following plan we discussed and let me know if I can assist you in the future.   Referrals/Orders/Follow-Ups/Clinician Recommendations: Patient was reminded of vaccines and dexa scan being due. Patient will discuss with PCP  This is a list of the screening recommended for you and due dates:  Health Maintenance  Topic Date Due   DTaP/Tdap/Td vaccine (1 - Tdap) Never done   Zoster (Shingles) Vaccine (1 of 2) Never done   DEXA scan (bone density measurement)  Never done   Medicare Annual Wellness Visit  06/11/2022   Flu Shot  08/10/2023*   Pneumonia Vaccine  Completed   HPV Vaccine  Aged Out   COVID-19 Vaccine  Discontinued  *Topic was postponed. The date shown is not the original due date.    Advanced directives: (Declined) Advance directive discussed with you today. Even though you declined this today, please call our office should you change your mind, and we can give you the proper paperwork for you to fill out.  Next Medicare Annual Wellness Visit scheduled for next year: Yes

## 2023-08-06 MED ORDER — LINACLOTIDE 72 MCG PO CAPS: 72.0000 ug | ORAL_CAPSULE | Freq: Every day | ORAL | 3 refills | Status: AC

## 2023-08-06 NOTE — Telephone Encounter (Signed)
 Visit on 05/27/23, where Dr. Allena Katz states that her constipation has improved with linzess.

## 2023-08-06 NOTE — Addendum Note (Signed)
 Addended by: Micael Hampshire on: 08/06/2023 08:21 AM   Modules accepted: Orders

## 2023-08-08 ENCOUNTER — Other Ambulatory Visit: Payer: Self-pay | Admitting: Internal Medicine

## 2023-08-08 DIAGNOSIS — E782 Mixed hyperlipidemia: Secondary | ICD-10-CM

## 2023-11-09 DIAGNOSIS — H401111 Primary open-angle glaucoma, right eye, mild stage: Secondary | ICD-10-CM | POA: Diagnosis not present

## 2023-11-09 DIAGNOSIS — H401122 Primary open-angle glaucoma, left eye, moderate stage: Secondary | ICD-10-CM | POA: Diagnosis not present

## 2023-11-24 ENCOUNTER — Encounter: Payer: Self-pay | Admitting: Internal Medicine

## 2023-11-24 ENCOUNTER — Other Ambulatory Visit: Payer: Self-pay | Admitting: Internal Medicine

## 2023-11-24 ENCOUNTER — Ambulatory Visit (INDEPENDENT_AMBULATORY_CARE_PROVIDER_SITE_OTHER): Payer: Self-pay | Admitting: Internal Medicine

## 2023-11-24 VITALS — BP 98/62 | HR 67 | Ht 64.0 in | Wt 165.0 lb

## 2023-11-24 DIAGNOSIS — E782 Mixed hyperlipidemia: Secondary | ICD-10-CM

## 2023-11-24 DIAGNOSIS — K5904 Chronic idiopathic constipation: Secondary | ICD-10-CM

## 2023-11-24 DIAGNOSIS — L110 Acquired keratosis follicularis: Secondary | ICD-10-CM | POA: Diagnosis not present

## 2023-11-24 DIAGNOSIS — N1831 Chronic kidney disease, stage 3a: Secondary | ICD-10-CM | POA: Diagnosis not present

## 2023-11-24 DIAGNOSIS — M79671 Pain in right foot: Secondary | ICD-10-CM | POA: Diagnosis not present

## 2023-11-24 DIAGNOSIS — F02B18 Dementia in other diseases classified elsewhere, moderate, with other behavioral disturbance: Secondary | ICD-10-CM

## 2023-11-24 DIAGNOSIS — I4821 Permanent atrial fibrillation: Secondary | ICD-10-CM

## 2023-11-24 DIAGNOSIS — I10 Essential (primary) hypertension: Secondary | ICD-10-CM

## 2023-11-24 DIAGNOSIS — I739 Peripheral vascular disease, unspecified: Secondary | ICD-10-CM | POA: Diagnosis not present

## 2023-11-24 DIAGNOSIS — G309 Alzheimer's disease, unspecified: Secondary | ICD-10-CM | POA: Diagnosis not present

## 2023-11-24 DIAGNOSIS — L609 Nail disorder, unspecified: Secondary | ICD-10-CM | POA: Diagnosis not present

## 2023-11-24 NOTE — Assessment & Plan Note (Signed)
 BP Readings from Last 1 Encounters:  11/24/23 98/62   Well-controlled, but decrease dose of Metoprolol  to 25 mg to avoid hypotension Counseled for compliance with the medications Advised DASH diet

## 2023-11-24 NOTE — Assessment & Plan Note (Addendum)
 Rate-controlled with Metoprolol , but would decrease dose to 25 mg every day due to bradycardia and borderline low BP - dose was reduced in the last visit, pharmacy still has dispensed old dose, communicated with daughter to give her only 25 mg dose On Eliquis  5 mg BID Followed by Cardiology in [place]

## 2023-11-24 NOTE — Assessment & Plan Note (Signed)
On Zocor 20 mg QD

## 2023-11-24 NOTE — Assessment & Plan Note (Signed)
On donepezil and memantine A&O X 3 currently, but sleeps at daytime and stays awake during nighttime Okay to take melatonin as needed for insomnia No episodes of agitation, but does not talk much with other people than her daughter and son-in-law 

## 2023-11-24 NOTE — Assessment & Plan Note (Signed)
Improved with Linzess now [employer] as needed Followed by GI

## 2023-11-24 NOTE — Progress Notes (Signed)
 Established Patient Office Visit  Subjective:  Patient ID: Brittany Hanson, female    DOB: 1932-01-05  Age: 89 y.o. MRN: [id number]  CC:  Chief Complaint  Patient presents with   Hypertension   Atrial Fibrillation   Dementia    HPI Brittany Hanson is a 89 y.o. female with past medical history of atrial fibrillation, HTN, Alzheimer's dementia, diffuse OA and physical deconditioning who presents for f/u of her chronic medical conditions.  HTN: BP is low normal, but stable compared to prior. Takes metoprolol  regularly for A-fib, unclear if 25 mg or 50 mg. Patient denies headache, dizziness, chest pain, dyspnea or palpitations.  Atrial fibrillation: She is followed by cardiology in [place].  She is on metoprolol  and Eliquis  currently.  She does not have any history of cardiac procedure in the past.  Alzheimer's dementia: She is on donepezil  and memantine  currently.  She is dependent for most of her ADLs.  She is able to eat by herself, but daughter has to help prepare the meals, bathing and clothing.  She watches TV during the nighttime and sleeps during the daytime. Daughter has reported in the past that patient has spells of worsening of dementia, that she repeats the same questions, but denies any episode of agitation, delusion or hallucinations.  Constipation has improved with Linzess  now.  Denies any melena or hematochezia now.  She has ingrown toenail of the right second toe.  She is planned to see podiatrist in [place] today.  She has chronic cyanotic discoloration of the bilateral feet, but denies any claudication symptoms.  She does not ambulate much at baseline.   Past Medical History:  Diagnosis Date   Atrial fibrillation (HCC)    Dementia (HCC)    Hypertension    Osteoarthritis     Past Surgical History:  Procedure Laterality Date   COLONOSCOPY WITH PROPOFOL  N/A 08/14/2021   internal/external hemorrhoids   ESOPHAGOGASTRODUODENOSCOPY (EGD) WITH PROPOFOL  N/A 08/14/2021    2 cm sliding hiatal hernia otherwise normal   GIVENS CAPSULE STUDY N/A 08/15/2021   Procedure: GIVENS CAPSULE STUDY;  Surgeon: Shaaron Lamar HERO, MD;  Location: [facility];  Service: Endoscopy;  Laterality: N/A;    Family History  Problem Relation Age of Onset   Diabetes Sister    Cancer Daughter     Social History   Socioeconomic History   Marital status: Widowed    Spouse name: Not on file   Number of children: 3   Years of education: 6th   Highest education level: Not on file  Occupational History   Not on file  Tobacco Use   Smoking status: Never   Smokeless tobacco: Never  Vaping Use   Vaping status: Never Used  Substance and Sexual Activity   Alcohol use: Never   Drug use: Never   Sexual activity: Not Currently  Other Topics Concern   Not on file  Social History Narrative   Not on file   Social Drivers of Health   Financial Resource Strain: Low Risk  (07/28/2023)   Overall Financial Resource Strain (CARDIA)    Difficulty of Paying Living Expenses: Not hard at all  Food Insecurity: No Food Insecurity (07/28/2023)   Hunger Vital Sign    Worried About Running Out of Food in the Last Year: Never true    Ran Out of Food in the Last Year: Never true  Transportation Needs: No Transportation Needs (07/28/2023)   PRAPARE - Administrator, Civil Service (Medical):  No    Lack of Transportation (Non-Medical): No  Physical Activity: Insufficiently Active (07/28/2023)   Exercise Vital Sign    Days of Exercise per Week: 1 day    Minutes of Exercise per Session: 10 min  Stress: No Stress Concern Present (07/28/2023)   [employer] of Occupational Health - Occupational Stress Questionnaire    Feeling of Stress : Not at all  Social Connections: Moderately Integrated (07/28/2023)   Social Connection and Isolation Panel    Frequency of Communication with Friends and Family: Twice a week    Frequency of Social Gatherings with Friends and Family: Twice a week     Attends Religious Services: 1 to 4 times per year    Active Member of [employer] or Organizations: Yes    Attends Banker Meetings: 1 to 4 times per year    Marital Status: Widowed  Intimate Partner Violence: Not At Risk (07/28/2023)   Humiliation, Afraid, Rape, and Kick questionnaire    Fear of Current or Ex-Partner: No    Emotionally Abused: No    Physically Abused: No    Sexually Abused: No    Outpatient Medications Prior to Visit  Medication Sig Dispense Refill   Acetaminophen  (TYLENOL  ARTHRITIS PAIN PO) Take 2 tablets by mouth in the morning.     donepezil  (ARICEPT ) 10 MG tablet Take 1 tablet (10 mg total) by mouth at bedtime. 90 tablet 3   ELIQUIS  5 MG TABS tablet TAKE 1 TABLET BY MOUTH TWICE DAILY. 60 tablet 11   ferrous sulfate 325 (65 FE) MG tablet Take 325 mg by mouth daily with breakfast.     ketoconazole  (NIZORAL ) 2 % cream APPLY 1 APPLICATION TOPICALLY DAILY 15 g 0   latanoprost (XALATAN) 0.005 % ophthalmic solution Place 1 drop into both eyes at bedtime.     linaclotide  (LINZESS ) 72 MCG capsule Take 1 capsule (72 mcg total) by mouth daily before breakfast. 90 capsule 3   memantine  (NAMENDA ) 5 MG tablet Take 1 tablet (5 mg total) by mouth 2 (two) times daily. 180 tablet 1   polyethylene glycol (MIRALAX  / GLYCOLAX ) 17 g packet Take 17 g by mouth daily.     simvastatin  (ZOCOR ) 20 MG tablet TAKE 1 TABLET BY MOUTH DAILY AT 6 PM. 90 tablet 3   metoprolol  succinate (TOPROL -XL) 25 MG 24 hr tablet Take 1 tablet (25 mg total) by mouth daily. Take with or immediately following a meal. 90 tablet 1   No facility-administered medications prior to visit.    No Known Allergies  ROS Review of Systems  Constitutional:  Positive for fatigue. Negative for chills and fever.  HENT:  Negative for congestion, sinus pressure, sinus pain and sore throat.   Eyes:  Negative for pain and discharge.  Respiratory:  Negative for cough and shortness of breath.   Cardiovascular:  Negative  for chest pain and palpitations.  Gastrointestinal:  Positive for constipation. Negative for abdominal pain, diarrhea, nausea and vomiting.  Endocrine: Negative for polydipsia and polyuria.  Genitourinary:  Negative for dysuria and hematuria.  Musculoskeletal:  Positive for arthralgias, back pain and gait problem. Negative for neck pain and neck stiffness.  Skin:  Negative for rash.  Neurological:  Positive for weakness. Negative for dizziness.  Psychiatric/Behavioral:  Positive for decreased concentration and sleep disturbance. Negative for agitation and behavioral problems.       Objective:    Physical Exam Vitals reviewed.  Constitutional:      General: She is not in acute  distress.    Appearance: She is not diaphoretic.     Comments: In wheelchair  HENT:     Head: Normocephalic and atraumatic.     Mouth/Throat:     Mouth: Mucous membranes are moist.  Eyes:     General: No scleral icterus.    Extraocular Movements: Extraocular movements intact.  Cardiovascular:     Rate and Rhythm: Normal rate. Rhythm irregular.     Heart sounds: Normal heart sounds. No murmur heard. Pulmonary:     Breath sounds: Normal breath sounds. No wheezing or rales.  Abdominal:     Palpations: Abdomen is soft.     Tenderness: There is no abdominal tenderness.  Musculoskeletal:        General: Swelling (B/l knee) present.     Cervical back: Neck supple. No tenderness.     Right lower leg: Edema (1+) present.     Left lower leg: Edema (1+) present.     Comments: ROM limited at b/l knee up to 45 degrees  Skin:    General: Skin is warm.     Findings: Erythema (With bluish discoloration of bilateral feet) present. No rash.  Neurological:     General: No focal deficit present.     Mental Status: She is alert. Mental status is at baseline.     Motor: Weakness (RLE and LLE - 2/5, RUE and LUE - 3/5) present.     Gait: Gait abnormal (Non-ambulatory mostly).  Psychiatric:        Mood and Affect: Mood  normal.        Behavior: Behavior normal.     BP 98/62 (BP Location: Left Arm)   Pulse 67   Ht 5' 4 (1.626 m)   Wt 165 lb (74.8 kg)   SpO2 97%   BMI 28.32 kg/m  Wt Readings from Last 3 Encounters:  11/24/23 165 lb (74.8 kg)  07/28/23 145 lb (65.8 kg)  05/27/23 165 lb (74.8 kg)    Lab Results  Component Value Date   TSH 2.570 11/24/2022   Lab Results  Component Value Date   WBC 5.3 05/27/2023   HGB 13.0 05/27/2023   HCT 40.9 05/27/2023   MCV 95 05/27/2023   PLT 209 05/27/2023   Lab Results  Component Value Date   NA 140 05/27/2023   K 4.4 05/27/2023   CO2 21 05/27/2023   GLUCOSE 134 (H) 05/27/2023   BUN 26 05/27/2023   CREATININE 1.23 (H) 05/27/2023   BILITOT 0.4 11/24/2022   ALKPHOS 91 11/24/2022   AST 19 11/24/2022   ALT 10 11/24/2022   PROT 6.1 11/24/2022   ALBUMIN 4.0 11/24/2022   CALCIUM 9.8 05/27/2023   ANIONGAP 7 08/13/2021   EGFR 42 (L) 05/27/2023   Lab Results  Component Value Date   CHOL 149 11/24/2022   Lab Results  Component Value Date   HDL 60 11/24/2022   Lab Results  Component Value Date   LDLCALC 74 11/24/2022   Lab Results  Component Value Date   TRIG 77 11/24/2022   Lab Results  Component Value Date   CHOLHDL 2.5 11/24/2022   Lab Results  Component Value Date   HGBA1C 5.6 11/24/2022      Assessment & Plan:   Problem List Items Addressed This Visit       Cardiovascular and Mediastinum   Atrial fibrillation (HCC) - Primary   Rate-controlled with Metoprolol , but would decrease dose to 25 mg every day due to bradycardia and borderline low  BP - dose was reduced in the last visit, pharmacy still has dispensed old dose, communicated with daughter to give her only 25 mg dose On Eliquis  5 mg BID Followed by Cardiology in [place]      Relevant Orders   TSH   Essential hypertension   BP Readings from Last 1 Encounters:  11/24/23 98/62   Well-controlled, but decrease dose of Metoprolol  to 25 mg to avoid  hypotension Counseled for compliance with the medications Advised DASH diet      PAD (peripheral artery disease) (HCC)   Weak DPA pulse of RLE, has bluish discoloration of b/l feet Could be ruptured capillaries She does not report claudication symptoms, but her ambulation is limited and she has dementia Had ordered US  arterial study for PAD, but could not get it done Ordered US  ABI screening On Eliquis  for A fib      Relevant Orders   TSH   US  ARTERIAL ABI (SCREENING LOWER EXTREMITY)     Nervous and Auditory   Dementia (HCC)   On donepezil  and memantine  A&O X 3 currently, but sleeps at daytime and stays awake during nighttime Okay to take melatonin as needed for insomnia No episodes of agitation, but does not talk much with other people than her daughter and son-in-law        Genitourinary   Stage 3a chronic kidney disease (HCC)   Last BMP reviewed, GFR ranges between 45-55 Avoid nephrotoxic agents Maintain adequate hydration Check CBC, CMP      Relevant Orders   CBC with Differential/Platelet   CMP14+EGFR     Other   Constipation   Improved with Linzess  now Takes MiraLAX  as needed Followed by GI      Relevant Orders   TSH   Mixed hyperlipidemia   On Zocor  20 mg QD         No orders of the defined types were placed in this encounter.   Follow-up: Return in about 6 months (around 05/26/2024) for HTN and CKD.    Suzzane MARLA Blanch, MD

## 2023-11-24 NOTE — Assessment & Plan Note (Addendum)
 Last BMP reviewed, GFR ranges between 45-55 Avoid nephrotoxic agents Maintain adequate hydration Check CBC, CMP

## 2023-11-24 NOTE — Patient Instructions (Signed)
 Please continue Metoprolol  25 mg once daily for now.  Please continue to take other medications as prescribed.  Please continue to follow low salt diet and ambulate as tolerated.

## 2023-11-24 NOTE — Assessment & Plan Note (Addendum)
 Weak DPA pulse of RLE, has bluish discoloration of b/l feet Could be ruptured capillaries She does not report claudication symptoms, but her ambulation is limited and she has dementia Had ordered US  arterial study for PAD, but could not get it done Ordered US  ABI screening On Eliquis  for A fib

## 2023-11-25 ENCOUNTER — Ambulatory Visit: Payer: Self-pay | Admitting: Internal Medicine

## 2023-11-25 LAB — CMP14+EGFR
ALT: 10 IU/L (ref 0–32)
AST: 19 IU/L (ref 0–40)
Albumin: 4.3 g/dL (ref 3.7–4.7)
Alkaline Phosphatase: 87 IU/L (ref 44–121)
BUN/Creatinine Ratio: 19 (ref 12–28)
BUN: 23 mg/dL (ref 8–27)
Bilirubin Total: 0.4 mg/dL (ref 0.0–1.2)
CO2: 19 mmol/L — ABNORMAL LOW (ref 20–29)
Calcium: 9.9 mg/dL (ref 8.7–10.3)
Chloride: 105 mmol/L (ref 96–106)
Creatinine, Ser: 1.18 mg/dL — ABNORMAL HIGH (ref 0.57–1.00)
Globulin, Total: 1.9 g/dL (ref 1.5–4.5)
Glucose: 105 mg/dL — ABNORMAL HIGH (ref 70–99)
Potassium: 4.4 mmol/L (ref 3.5–5.2)
Sodium: 142 mmol/L (ref 134–144)
Total Protein: 6.2 g/dL (ref 6.0–8.5)
eGFR: 44 mL/min/1.73 — ABNORMAL LOW (ref >59)

## 2023-11-25 LAB — CBC WITH DIFFERENTIAL/PLATELET
Basophils Absolute: 0.1 x10E3/uL (ref 0.0–0.2)
Basos: 1 %
EOS (ABSOLUTE): 0.2 x10E3/uL (ref 0.0–0.4)
Eos: 3 %
Hematocrit: 39.4 % (ref 34.0–46.6)
Hemoglobin: 12.3 g/dL (ref 11.1–15.9)
Immature Grans (Abs): 0 x10E3/uL (ref 0.0–0.1)
Immature Granulocytes: 0 %
Lymphocytes Absolute: 0.9 x10E3/uL (ref 0.7–3.1)
Lymphs: 17 %
MCH: 30 pg (ref 26.6–33.0)
MCHC: 31.2 g/dL — ABNORMAL LOW (ref 31.5–35.7)
MCV: 96 fL (ref 79–97)
Monocytes Absolute: 0.5 x10E3/uL (ref 0.1–0.9)
Monocytes: 10 %
Neutrophils Absolute: 3.6 x10E3/uL (ref 1.4–7.0)
Neutrophils: 69 %
Platelets: 191 x10E3/uL (ref 150–450)
RBC: 4.1 x10E6/uL (ref 3.77–5.28)
RDW: 12.8 % (ref 11.7–15.4)
WBC: 5.2 x10E3/uL (ref 3.4–10.8)

## 2023-11-25 LAB — TSH: TSH: 3.06 u[IU]/mL (ref 0.450–4.500)

## 2023-12-01 ENCOUNTER — Ambulatory Visit (HOSPITAL_COMMUNITY): Admission: RE | Admit: 2023-12-01 | Source: Ambulatory Visit

## 2024-02-02 ENCOUNTER — Ambulatory Visit: Admitting: Family Medicine

## 2024-02-11 DIAGNOSIS — L110 Acquired keratosis follicularis: Secondary | ICD-10-CM | POA: Diagnosis not present

## 2024-02-11 DIAGNOSIS — M79671 Pain in right foot: Secondary | ICD-10-CM | POA: Diagnosis not present

## 2024-02-11 DIAGNOSIS — I739 Peripheral vascular disease, unspecified: Secondary | ICD-10-CM | POA: Diagnosis not present

## 2024-02-11 DIAGNOSIS — L609 Nail disorder, unspecified: Secondary | ICD-10-CM | POA: Diagnosis not present

## 2024-02-13 ENCOUNTER — Other Ambulatory Visit: Payer: Self-pay | Admitting: Internal Medicine

## 2024-02-13 DIAGNOSIS — E782 Mixed hyperlipidemia: Secondary | ICD-10-CM

## 2024-02-25 ENCOUNTER — Other Ambulatory Visit: Payer: Self-pay | Admitting: Internal Medicine

## 2024-02-25 DIAGNOSIS — G309 Alzheimer's disease, unspecified: Secondary | ICD-10-CM

## 2024-03-03 DIAGNOSIS — I482 Chronic atrial fibrillation, unspecified: Secondary | ICD-10-CM | POA: Diagnosis not present

## 2024-03-03 DIAGNOSIS — I10 Essential (primary) hypertension: Secondary | ICD-10-CM | POA: Diagnosis not present

## 2024-03-03 DIAGNOSIS — Z7901 Long term (current) use of anticoagulants: Secondary | ICD-10-CM | POA: Diagnosis not present

## 2024-03-03 DIAGNOSIS — E785 Hyperlipidemia, unspecified: Secondary | ICD-10-CM | POA: Diagnosis not present

## 2024-03-15 DIAGNOSIS — D6869 Other thrombophilia: Secondary | ICD-10-CM | POA: Diagnosis not present

## 2024-04-23 ENCOUNTER — Other Ambulatory Visit: Payer: Self-pay | Admitting: Internal Medicine

## 2024-04-23 DIAGNOSIS — G309 Alzheimer's disease, unspecified: Secondary | ICD-10-CM

## 2024-05-26 ENCOUNTER — Encounter: Payer: Self-pay | Admitting: Internal Medicine

## 2024-05-26 ENCOUNTER — Ambulatory Visit: Admitting: Internal Medicine

## 2024-05-26 VITALS — BP 124/68 | HR 60 | Ht 63.0 in | Wt 165.0 lb

## 2024-05-26 DIAGNOSIS — N1831 Chronic kidney disease, stage 3a: Secondary | ICD-10-CM

## 2024-05-26 DIAGNOSIS — G309 Alzheimer's disease, unspecified: Secondary | ICD-10-CM | POA: Diagnosis not present

## 2024-05-26 DIAGNOSIS — R7303 Prediabetes: Secondary | ICD-10-CM | POA: Diagnosis not present

## 2024-05-26 DIAGNOSIS — K5904 Chronic idiopathic constipation: Secondary | ICD-10-CM

## 2024-05-26 DIAGNOSIS — I10 Essential (primary) hypertension: Secondary | ICD-10-CM

## 2024-05-26 DIAGNOSIS — Z0001 Encounter for general adult medical examination with abnormal findings: Secondary | ICD-10-CM | POA: Diagnosis not present

## 2024-05-26 DIAGNOSIS — E782 Mixed hyperlipidemia: Secondary | ICD-10-CM | POA: Diagnosis not present

## 2024-05-26 DIAGNOSIS — F02B18 Dementia in other diseases classified elsewhere, moderate, with other behavioral disturbance: Secondary | ICD-10-CM

## 2024-05-26 DIAGNOSIS — R296 Repeated falls: Secondary | ICD-10-CM | POA: Diagnosis not present

## 2024-05-26 DIAGNOSIS — I4821 Permanent atrial fibrillation: Secondary | ICD-10-CM

## 2024-05-26 NOTE — Assessment & Plan Note (Signed)
 On donepezil  and memantine  A&O to place and person currently, but sleeps at daytime and stays awake during nighttime Okay to take melatonin as needed for insomnia No episodes of agitation, but does not talk much with other people than her daughter and son-in-law  Daughter asked for home care, referred to home health for PT and domiciliary services

## 2024-05-26 NOTE — Assessment & Plan Note (Signed)
 BP Readings from Last 1 Encounters:  05/26/24 124/68   Well-controlled, but decreased dose of Metoprolol  to 25 mg to avoid hypotension previously Counseled for compliance with the medications Advised DASH diet

## 2024-05-26 NOTE — Patient Instructions (Addendum)
 Please contact [facility] for home care services. [phone] [email or web address]  Please continue to take medications as prescribed.  Please continue to follow low salt diet.

## 2024-05-26 NOTE — Progress Notes (Signed)
 "  Established Patient Office Visit  Subjective:  Patient ID: Brittany Hanson, female    DOB: [birth year]  Age: 89 y.o. MRN: [id number]  CC:  Chief Complaint  Patient presents with   Follow-up    Would like to discuss options for home health has had multiple falls per daughter, states she needs assistance with her mom    HPI Brittany Hanson is a 89 y.o. female with past medical history of atrial fibrillation, HTN, Alzheimer's dementia, diffuse OA and physical deconditioning who presents for f/u of her chronic medical conditions.  HTN: BP is better compared to prior. Takes metoprolol  25 mg QD regularly for A-fib. Patient denies headache, dizziness, chest pain, dyspnea or palpitations.  Atrial fibrillation: She is followed by cardiology in [place].  She is on metoprolol  and Eliquis  currently.  She does not have any history of cardiac procedure in the past.  Alzheimer's dementia: She is on donepezil  and memantine  currently.  She is dependent for most of her ADLs.  She is able to eat by herself, but daughter has to help prepare the meals, bathing and clothing.  Daughter reports that she is bowel and bladder incontinent now, and has to keep the diapers on.  She has had recurrent falls in the last 3 months while using bathroom.  She asks for assistance for care.  She watches TV during the nighttime and sleeps during the daytime. Daughter has reported in the past that patient has spells of worsening of dementia, that she repeats the same questions, but denies any episode of agitation, delusion or hallucinations.  Constipation has improved with Linzess  now.  She still takes MiraLAX  every other day.  Denies any melena or hematochezia now.  She has chronic cyanotic discoloration of the bilateral feet, but denies any claudication symptoms.  She does not ambulate much at baseline.   Past Medical History:  Diagnosis Date   Atrial fibrillation (HCC)    Dementia (HCC)    Hypertension     Osteoarthritis     Past Surgical History:  Procedure Laterality Date   COLONOSCOPY WITH PROPOFOL  N/A 08/14/2021   internal/external hemorrhoids   ESOPHAGOGASTRODUODENOSCOPY (EGD) WITH PROPOFOL  N/A 08/14/2021   2 cm sliding hiatal hernia otherwise normal   GIVENS CAPSULE STUDY N/A 08/15/2021   Procedure: GIVENS CAPSULE STUDY;  Surgeon: Shaaron Lamar HERO, MD;  Location: [facility];  Service: Endoscopy;  Laterality: N/A;    Family History  Problem Relation Age of Onset   Diabetes Sister    Cancer Daughter     Social History   Socioeconomic History   Marital status: Widowed    Spouse name: Not on file   Number of children: 3   Years of education: 6th   Highest education level: Not on file  Occupational History   Not on file  Tobacco Use   Smoking status: Never   Smokeless tobacco: Never  Vaping Use   Vaping status: Never Used  Substance and Sexual Activity   Alcohol use: Never   Drug use: Never   Sexual activity: Not Currently  Other Topics Concern   Not on file  Social History Narrative   Not on file   Social Drivers of Health   Tobacco Use: Low Risk (05/26/2024)   Patient History    Smoking Tobacco Use: Never    Smokeless Tobacco Use: Never    Passive Exposure: Not on file  Financial Resource Strain: Low Risk (07/28/2023)   Overall Financial Resource Strain (CARDIA)  Difficulty of Paying Living Expenses: Not hard at all  Food Insecurity: No Food Insecurity (07/28/2023)   Hunger Vital Sign    Worried About Running Out of Food in the Last Year: Never true    Ran Out of Food in the Last Year: Never true  Transportation Needs: No Transportation Needs (07/28/2023)   PRAPARE - Administrator, Civil Service (Medical): No    Lack of Transportation (Non-Medical): No  Physical Activity: Insufficiently Active (07/28/2023)   Exercise Vital Sign    Days of Exercise per Week: 1 day    Minutes of Exercise per Session: 10 min  Stress: No Stress Concern Present  (07/28/2023)   [employer] of Occupational Health - Occupational Stress Questionnaire    Feeling of Stress : Not at all  Social Connections: Moderately Integrated (07/28/2023)   Social Connection and Isolation Panel    Frequency of Communication with Friends and Family: Twice a week    Frequency of Social Gatherings with Friends and Family: Twice a week    Attends Religious Services: 1 to 4 times per year    Active Member of [employer] or Organizations: Yes    Attends Banker Meetings: 1 to 4 times per year    Marital Status: Widowed  Intimate Partner Violence: Not At Risk (07/28/2023)   Humiliation, Afraid, Rape, and Kick questionnaire    Fear of Current or Ex-Partner: No    Emotionally Abused: No    Physically Abused: No    Sexually Abused: No  Depression (PHQ2-9): Low Risk (11/24/2023)   Depression (PHQ2-9)    PHQ-2 Score: 0  Alcohol Screen: Low Risk (07/28/2023)   Alcohol Screen    Last Alcohol Screening Score (AUDIT): 0  Housing: Unknown (07/28/2023)   Housing Stability Vital Sign    Unable to Pay for Housing in the Last Year: No    Number of Times Moved in the Last Year: Not on file    Homeless in the Last Year: No  Utilities: Not At Risk (07/28/2023)   AHC Utilities    Threatened with loss of utilities: No  Health Literacy: Not on file    Outpatient Medications Prior to Visit  Medication Sig Dispense Refill   Acetaminophen  (TYLENOL  ARTHRITIS PAIN PO) Take 2 tablets by mouth in the morning.     donepezil  (ARICEPT ) 10 MG tablet TAKE 1 TABLET(10 MG) BY MOUTH AT BEDTIME 90 tablet 3   ELIQUIS  5 MG TABS tablet TAKE 1 TABLET BY MOUTH TWICE DAILY. 60 tablet 11   ferrous sulfate 325 (65 FE) MG tablet Take 325 mg by mouth daily with breakfast.     ketoconazole  (NIZORAL ) 2 % cream APPLY 1 APPLICATION TOPICALLY DAILY 15 g 0   latanoprost (XALATAN) 0.005 % ophthalmic solution Place 1 drop into both eyes at bedtime.     linaclotide  (LINZESS ) 72 MCG capsule Take 1 capsule (72  mcg total) by mouth daily before breakfast. 90 capsule 3   memantine  (NAMENDA ) 5 MG tablet TAKE 1 TABLET BY MOUTH TWICE DAILY 60 tablet 1   metoprolol  succinate (TOPROL -XL) 25 MG 24 hr tablet TAKE 1 TABLET(25 MG) BY MOUTH DAILY WITH OR IMMEDIATELY FOLLOWING A MEAL 90 tablet 1   polyethylene glycol (MIRALAX  / GLYCOLAX ) 17 g packet Take 17 g by mouth daily.     simvastatin  (ZOCOR ) 20 MG tablet TAKE 1 TABLET(20 MG) BY MOUTH DAILY AT 6 PM 90 tablet 3   No facility-administered medications prior to visit.    No  Known Allergies  ROS Review of Systems  Constitutional:  Positive for fatigue. Negative for chills and fever.  HENT:  Negative for congestion, sinus pressure, sinus pain and sore throat.   Eyes:  Negative for pain and discharge.  Respiratory:  Negative for cough and shortness of breath.   Cardiovascular:  Negative for chest pain and palpitations.  Gastrointestinal:  Positive for constipation. Negative for abdominal pain, diarrhea, nausea and vomiting.  Endocrine: Negative for polydipsia and polyuria.  Genitourinary:  Negative for dysuria and hematuria.  Musculoskeletal:  Positive for arthralgias, back pain and gait problem. Negative for neck pain and neck stiffness.  Skin:  Negative for rash.  Neurological:  Positive for weakness. Negative for dizziness.  Psychiatric/Behavioral:  Positive for decreased concentration and sleep disturbance. Negative for agitation and behavioral problems.       Objective:    Physical Exam Vitals reviewed.  Constitutional:      General: She is not in acute distress.    Appearance: She is not diaphoretic.     Comments: In wheelchair  HENT:     Head: Normocephalic and atraumatic.     Mouth/Throat:     Mouth: Mucous membranes are moist.  Eyes:     General: No scleral icterus.    Extraocular Movements: Extraocular movements intact.  Cardiovascular:     Rate and Rhythm: Normal rate. Rhythm irregular.     Heart sounds: Normal heart sounds. No  murmur heard. Pulmonary:     Breath sounds: Normal breath sounds. No wheezing or rales.  Abdominal:     Palpations: Abdomen is soft.     Tenderness: There is no abdominal tenderness.  Musculoskeletal:        General: Swelling (B/l knee) present.     Cervical back: Neck supple. No tenderness.     Right lower leg: Edema (1+) present.     Left lower leg: Edema (1+) present.     Comments: ROM limited at b/l knee up to 45 degrees  Skin:    General: Skin is warm.     Findings: Erythema (With bluish discoloration of bilateral feet) present. No rash.  Neurological:     General: No focal deficit present.     Mental Status: She is alert. Mental status is at baseline.     Motor: Weakness (RLE and LLE - 2/5, RUE and LUE - 3/5) present.     Gait: Gait abnormal (Non-ambulatory mostly).  Psychiatric:        Mood and Affect: Mood normal.        Behavior: Behavior normal.     BP 124/68   Pulse 60   Ht 5' 3 (1.6 m)   Wt 165 lb (74.8 kg)   SpO2 95%   BMI 29.23 kg/m  Wt Readings from Last 3 Encounters:  05/26/24 165 lb (74.8 kg)  11/24/23 165 lb (74.8 kg)  07/28/23 145 lb (65.8 kg)    Lab Results  Component Value Date   TSH 3.060 11/24/2023   Lab Results  Component Value Date   WBC 5.2 11/24/2023   HGB 12.3 11/24/2023   HCT 39.4 11/24/2023   MCV 96 11/24/2023   PLT 191 11/24/2023   Lab Results  Component Value Date   NA 142 11/24/2023   K 4.4 11/24/2023   CO2 19 (L) 11/24/2023   GLUCOSE 105 (H) 11/24/2023   BUN 23 11/24/2023   CREATININE 1.18 (H) 11/24/2023   BILITOT 0.4 11/24/2023   ALKPHOS 87 11/24/2023   AST 19  11/24/2023   ALT 10 11/24/2023   PROT 6.2 11/24/2023   ALBUMIN 4.3 11/24/2023   CALCIUM 9.9 11/24/2023   ANIONGAP 7 08/13/2021   EGFR 44 (L) 11/24/2023   Lab Results  Component Value Date   CHOL 149 11/24/2022   Lab Results  Component Value Date   HDL 60 11/24/2022   Lab Results  Component Value Date   LDLCALC 74 11/24/2022   Lab Results   Component Value Date   TRIG 77 11/24/2022   Lab Results  Component Value Date   CHOLHDL 2.5 11/24/2022   Lab Results  Component Value Date   HGBA1C 5.6 11/24/2022      Assessment & Plan:   Problem List Items Addressed This Visit       Cardiovascular and Mediastinum   Atrial fibrillation (HCC)   Rate-controlled with Metoprolol  25 mg QD On Eliquis  5 mg BID Followed by Cardiology in [place]      Relevant Orders   Ambulatory referral to Home Health   Essential hypertension   BP Readings from Last 1 Encounters:  05/26/24 124/68   Well-controlled, but decreased dose of Metoprolol  to 25 mg to avoid hypotension previously Counseled for compliance with the medications Advised DASH diet        Digestive   Chronic idiopathic constipation   Improved with Linzess  now Takes MiraLAX  as needed Followed by GI        Nervous and Auditory   Moderate Alzheimer's dementia (HCC)   On donepezil  and memantine  A&O to place and person currently, but sleeps at daytime and stays awake during nighttime Okay to take melatonin as needed for insomnia No episodes of agitation, but does not talk much with other people than her daughter and son-in-law  Daughter asked for home care, referred to home health for PT and domiciliary services      Relevant Orders   Ambulatory referral to Home Health     Genitourinary   Stage 3a chronic kidney disease (HCC)   Last BMP reviewed, GFR ranges between 45-55 Avoid nephrotoxic agents Maintain adequate hydration Check CBC, CMP      Relevant Orders   CBC with Differential/Platelet   CMP14+EGFR     Other   Encounter for general adult medical examination with abnormal findings   Physical exam as documented. Fasting blood tests today. Denies flu vaccine.  Advised to get Shingrix and Tdap vaccine at local pharmacy, but she denies. Denies DEXA scan.      Mixed hyperlipidemia   On Zocor  20 mg QD      Recurrent falls - Primary   Likely  due to chronic LE weakness from muscular atrophy Has very limited ambulation, has had falls while using bathroom and after shower Dependent for ADLs currently Would benefit from home health domiciliary services, referral placed      Relevant Orders   CBC with Differential/Platelet   CMP14+EGFR   TSH   Ambulatory referral to Home Health   Other Visit Diagnoses       Prediabetes       Relevant Orders   Hemoglobin A1c         No orders of the defined types were placed in this encounter.   Follow-up: Return in about 6 months (around 11/23/2024).    Brittany MARLA Blanch, MD "

## 2024-05-26 NOTE — Assessment & Plan Note (Addendum)
 Physical exam as documented. Fasting blood tests today. Denies flu vaccine.  Advised to get Shingrix and Tdap vaccine at local pharmacy, but she denies. Denies DEXA scan.

## 2024-05-26 NOTE — Assessment & Plan Note (Signed)
On Zocor 20 mg QD

## 2024-05-26 NOTE — Assessment & Plan Note (Signed)
 Last BMP reviewed, GFR ranges between 45-55 Avoid nephrotoxic agents Maintain adequate hydration Check CBC, CMP

## 2024-05-26 NOTE — Assessment & Plan Note (Signed)
 Likely due to chronic LE weakness from muscular atrophy Has very limited ambulation, has had falls while using bathroom and after shower Dependent for ADLs currently Would benefit from home health domiciliary services, referral placed

## 2024-05-26 NOTE — Assessment & Plan Note (Signed)
 Rate-controlled with Metoprolol  25 mg QD On Eliquis  5 mg BID Followed by Cardiology in [place]

## 2024-05-26 NOTE — Assessment & Plan Note (Signed)
Improved with Linzess now [employer] as needed Followed by GI

## 2024-05-27 ENCOUNTER — Other Ambulatory Visit: Payer: Self-pay | Admitting: Internal Medicine

## 2024-05-27 DIAGNOSIS — G309 Alzheimer's disease, unspecified: Secondary | ICD-10-CM

## 2024-05-27 LAB — CMP14+EGFR
ALT: 6 IU/L (ref 0–32)
AST: 15 IU/L (ref 0–40)
Albumin: 4 g/dL (ref 3.6–4.6)
Alkaline Phosphatase: 116 IU/L (ref 48–129)
BUN/Creatinine Ratio: 17 (ref 12–28)
BUN: 18 mg/dL (ref 10–36)
Bilirubin Total: 0.4 mg/dL (ref 0.0–1.2)
CO2: 24 mmol/L (ref 20–29)
Calcium: 9.7 mg/dL (ref 8.7–10.3)
Chloride: 105 mmol/L (ref 96–106)
Creatinine, Ser: 1.05 mg/dL — ABNORMAL HIGH (ref 0.57–1.00)
Globulin, Total: 2 g/dL (ref 1.5–4.5)
Glucose: 106 mg/dL — ABNORMAL HIGH (ref 70–99)
Potassium: 4 mmol/L (ref 3.5–5.2)
Sodium: 143 mmol/L (ref 134–144)
Total Protein: 6 g/dL (ref 6.0–8.5)
eGFR: 50 mL/min/1.73 — ABNORMAL LOW

## 2024-05-27 LAB — CBC WITH DIFFERENTIAL/PLATELET
Basophils Absolute: 0.1 x10E3/uL (ref 0.0–0.2)
Basos: 1 %
EOS (ABSOLUTE): 0.2 x10E3/uL (ref 0.0–0.4)
Eos: 3 %
Hematocrit: 37.1 % (ref 34.0–46.6)
Hemoglobin: 11.6 g/dL (ref 11.1–15.9)
Immature Grans (Abs): 0 x10E3/uL (ref 0.0–0.1)
Immature Granulocytes: 0 %
Lymphocytes Absolute: 0.8 x10E3/uL (ref 0.7–3.1)
Lymphs: 16 %
MCH: 28.6 pg (ref 26.6–33.0)
MCHC: 31.3 g/dL — ABNORMAL LOW (ref 31.5–35.7)
MCV: 91 fL (ref 79–97)
Monocytes Absolute: 0.5 x10E3/uL (ref 0.1–0.9)
Monocytes: 11 %
Neutrophils Absolute: 3.2 x10E3/uL (ref 1.4–7.0)
Neutrophils: 69 %
Platelets: 206 x10E3/uL (ref 150–450)
RBC: 4.06 x10E6/uL (ref 3.77–5.28)
RDW: 13.4 % (ref 11.7–15.4)
WBC: 4.7 x10E3/uL (ref 3.4–10.8)

## 2024-05-27 LAB — TSH: TSH: 3.17 u[IU]/mL (ref 0.450–4.500)

## 2024-05-27 LAB — HEMOGLOBIN A1C
Est. average glucose Bld gHb Est-mCnc: 111 mg/dL
Hgb A1c MFr Bld: 5.5 % (ref 4.8–5.6)

## 2024-05-30 ENCOUNTER — Ambulatory Visit: Payer: Self-pay | Admitting: Internal Medicine

## 2024-06-03 ENCOUNTER — Other Ambulatory Visit: Payer: Self-pay | Admitting: Internal Medicine

## 2024-06-03 DIAGNOSIS — I10 Essential (primary) hypertension: Secondary | ICD-10-CM

## 2024-06-03 DIAGNOSIS — I4821 Permanent atrial fibrillation: Secondary | ICD-10-CM

## 2024-06-15 ENCOUNTER — Other Ambulatory Visit: Payer: Self-pay | Admitting: Internal Medicine

## 2024-06-15 DIAGNOSIS — I4821 Permanent atrial fibrillation: Secondary | ICD-10-CM

## 2024-08-01 ENCOUNTER — Ambulatory Visit

## 2024-11-23 ENCOUNTER — Ambulatory Visit: Payer: Self-pay | Admitting: Internal Medicine
